# Patient Record
Sex: Female | Born: 1990 | Race: White | Hispanic: No | Marital: Single | Smoking: Never smoker
Health system: Southern US, Community
[De-identification: ages and names within clinical notes are randomized; demographics above are authoritative.]

## PROBLEM LIST (undated history)

## (undated) ENCOUNTER — Inpatient Hospital Stay (HOSPITAL_COMMUNITY): Payer: Self-pay

## (undated) DIAGNOSIS — K9429 Other complications of gastrostomy: Secondary | ICD-10-CM

## (undated) DIAGNOSIS — F410 Panic disorder [episodic paroxysmal anxiety] without agoraphobia: Secondary | ICD-10-CM

## (undated) DIAGNOSIS — R519 Headache, unspecified: Secondary | ICD-10-CM

## (undated) DIAGNOSIS — R51 Headache: Secondary | ICD-10-CM

## (undated) DIAGNOSIS — K219 Gastro-esophageal reflux disease without esophagitis: Secondary | ICD-10-CM

## (undated) HISTORY — DX: Gastro-esophageal reflux disease without esophagitis: K21.9

## (undated) HISTORY — PX: MYRINGOTOMY: SUR874

---
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
## 2012-01-31 ENCOUNTER — Inpatient Hospital Stay (HOSPITAL_COMMUNITY)
Admission: AD | Admit: 2012-01-31 | Discharge: 2012-02-02 | DRG: 775 | Disposition: A | Discharge status: Home / Self Care | Payer: Medicaid Other | Source: Ambulatory Visit | Attending: Obstetrics | Admitting: Obstetrics

## 2012-01-31 ENCOUNTER — Encounter (HOSPITAL_COMMUNITY): Payer: Self-pay | Admitting: *Deleted

## 2012-01-31 LAB — CBC
MCH: 27.5 pg (ref 26.0–34.0)
MCHC: 34.9 g/dL (ref 30.0–36.0)
Platelets: 221 10*3/uL (ref 150–400)
RBC: 4.14 MIL/uL (ref 3.87–5.11)

## 2012-01-31 LAB — RPR: RPR Ser Ql: NONREACTIVE

## 2012-01-31 MED ORDER — OXYTOCIN 40 UNITS IN LACTATED RINGERS INFUSION - SIMPLE MED
62.5000 mL/h | Freq: Once | INTRAVENOUS | Status: DC
  Filled 2012-01-31: qty 1000

## 2012-01-31 MED ORDER — LACTATED RINGERS IV SOLN
INTRAVENOUS | Status: DC
  Administered 2012-01-31 (×2): via INTRAVENOUS

## 2012-01-31 MED ORDER — LIDOCAINE HCL (PF) 1 % IJ SOLN
30.0000 mL | INTRAMUSCULAR | Status: DC | PRN
  Filled 2012-01-31: qty 30

## 2012-01-31 MED ORDER — DIPHENHYDRAMINE HCL 25 MG PO CAPS: 25.0000 mg | ORAL_CAPSULE | Freq: Four times a day (QID) | ORAL | Status: DC | PRN

## 2012-01-31 MED ORDER — OXYTOCIN BOLUS FROM INFUSION
500.0000 mL | Freq: Once | INTRAVENOUS | Status: DC
  Filled 2012-01-31: qty 500

## 2012-01-31 MED ORDER — EPHEDRINE 5 MG/ML INJ: 10.0000 mg | INTRAVENOUS | Status: DC | PRN

## 2012-01-31 MED ORDER — IBUPROFEN 600 MG PO TABS
600.0000 mg | ORAL_TABLET | Freq: Four times a day (QID) | ORAL | Status: DC
  Administered 2012-01-31 – 2012-02-02 (×6): 600 mg via ORAL
  Filled 2012-01-31 (×3): qty 1

## 2012-01-31 MED ORDER — LACTATED RINGERS IV SOLN: 500.0000 mL | Freq: Once | INTRAVENOUS | Status: DC

## 2012-01-31 MED ORDER — OXYCODONE-ACETAMINOPHEN 5-325 MG PO TABS
1.0000 | ORAL_TABLET | ORAL | Status: DC | PRN
  Administered 2012-02-01 – 2012-02-02 (×3): 1 via ORAL
  Filled 2012-01-31 (×3): qty 1

## 2012-01-31 MED ORDER — ACETAMINOPHEN 325 MG PO TABS: 650.0000 mg | ORAL_TABLET | ORAL | Status: DC | PRN

## 2012-01-31 MED ORDER — SIMETHICONE 80 MG PO CHEW
80.0000 mg | CHEWABLE_TABLET | ORAL | Status: DC | PRN
Start: 2012-01-31 — End: 2012-02-02

## 2012-01-31 MED ORDER — BENZOCAINE-MENTHOL 20-0.5 % EX AERO
1.0000 "application " | INHALATION_SPRAY | CUTANEOUS | Status: DC | PRN
  Administered 2012-01-31: 1 via TOPICAL
  Filled 2012-01-31: qty 56

## 2012-01-31 MED ORDER — LACTATED RINGERS IV SOLN: 500.0000 mL | INTRAVENOUS | Status: DC | PRN

## 2012-01-31 MED ORDER — FLEET ENEMA 7-19 GM/118ML RE ENEM: 1.0000 | ENEMA | RECTAL | Status: DC | PRN

## 2012-01-31 MED ORDER — FERROUS SULFATE 325 (65 FE) MG PO TABS
325.0000 mg | ORAL_TABLET | Freq: Two times a day (BID) | ORAL | Status: DC
  Administered 2012-01-31 – 2012-02-02 (×4): 325 mg via ORAL
  Filled 2012-01-31 (×4): qty 1

## 2012-01-31 MED ORDER — BUTORPHANOL TARTRATE 1 MG/ML IJ SOLN
1.0000 mg | INTRAMUSCULAR | Status: DC | PRN
  Administered 2012-01-31 (×3): 1 mg via INTRAVENOUS
  Filled 2012-01-31 (×3): qty 1

## 2012-01-31 MED ORDER — LANOLIN HYDROUS EX OINT: TOPICAL_OINTMENT | CUTANEOUS | Status: DC | PRN

## 2012-01-31 MED ORDER — ONDANSETRON HCL 4 MG/2ML IJ SOLN: 4.0000 mg | INTRAMUSCULAR | Status: DC | PRN

## 2012-01-31 MED ORDER — FENTANYL 2.5 MCG/ML BUPIVACAINE 1/10 % EPIDURAL INFUSION (WH - ANES): 14.0000 mL/h | INTRAMUSCULAR | Status: DC

## 2012-01-31 MED ORDER — PHENYLEPHRINE 40 MCG/ML (10ML) SYRINGE FOR IV PUSH (FOR BLOOD PRESSURE SUPPORT): 80.0000 ug | PREFILLED_SYRINGE | INTRAVENOUS | Status: DC | PRN

## 2012-01-31 MED ORDER — CITRIC ACID-SODIUM CITRATE 334-500 MG/5ML PO SOLN: 30.0000 mL | ORAL | Status: DC | PRN

## 2012-01-31 MED ORDER — PRENATAL MULTIVITAMIN CH
1.0000 | ORAL_TABLET | Freq: Every day | ORAL | Status: DC
  Administered 2012-01-31 – 2012-02-02 (×3): 1 via ORAL
  Filled 2012-01-31 (×3): qty 1

## 2012-01-31 MED ORDER — ONDANSETRON HCL 4 MG PO TABS: 4.0000 mg | ORAL_TABLET | ORAL | Status: DC | PRN

## 2012-01-31 MED ORDER — ZOLPIDEM TARTRATE 5 MG PO TABS: 5.0000 mg | ORAL_TABLET | Freq: Every evening | ORAL | Status: DC | PRN

## 2012-01-31 MED ORDER — TETANUS-DIPHTH-ACELL PERTUSSIS 5-2.5-18.5 LF-MCG/0.5 IM SUSP: 0.5000 mL | Freq: Once | INTRAMUSCULAR | Status: DC

## 2012-01-31 MED ORDER — IBUPROFEN 600 MG PO TABS
600.0000 mg | ORAL_TABLET | Freq: Four times a day (QID) | ORAL | Status: DC | PRN
  Administered 2012-02-01: 600 mg via ORAL
  Filled 2012-01-31 (×4): qty 1

## 2012-01-31 MED ORDER — DIPHENHYDRAMINE HCL 50 MG/ML IJ SOLN: 12.5000 mg | INTRAMUSCULAR | Status: DC | PRN

## 2012-01-31 MED ORDER — DIBUCAINE 1 % RE OINT: 1.0000 "application " | TOPICAL_OINTMENT | RECTAL | Status: DC | PRN

## 2012-01-31 MED ORDER — ONDANSETRON HCL 4 MG/2ML IJ SOLN
4.0000 mg | Freq: Four times a day (QID) | INTRAMUSCULAR | Status: DC | PRN
  Administered 2012-01-31: 4 mg via INTRAVENOUS
  Filled 2012-01-31: qty 2

## 2012-01-31 MED ORDER — SENNOSIDES-DOCUSATE SODIUM 8.6-50 MG PO TABS
2.0000 | ORAL_TABLET | Freq: Every day | ORAL | Status: DC
  Administered 2012-01-31 – 2012-02-01 (×2): 2 via ORAL

## 2012-01-31 MED ORDER — OXYCODONE-ACETAMINOPHEN 5-325 MG PO TABS: 1.0000 | ORAL_TABLET | ORAL | Status: DC | PRN

## 2012-01-31 MED ORDER — WITCH HAZEL-GLYCERIN EX PADS: 1.0000 "application " | MEDICATED_PAD | CUTANEOUS | Status: DC | PRN

## 2012-01-31 MED ORDER — RHO D IMMUNE GLOBULIN 1500 UNIT/2ML IJ SOLN
300.0000 ug | Freq: Once | INTRAMUSCULAR | Status: DC
  Filled 2012-01-31: qty 2

## 2012-01-31 NOTE — H&P (Signed)
This is Dr. Francoise Ceo dictating the history and physical on  Julie Hughes she's a 21 year old gravida 1 at 5 weeks and 6 days her EDC is 02/01/2012 negative GBS admitted in labor cervix is 5 cm she's now 9 cm 100% vertex -1 amniotomy was performed the fluids clear Past medical history negative Past surgical history negative Social history negative Family history negative System review noncontributory Physical exam revealed a well-developed female in labor HEENT negative Breasts negative Lungs clear to P&A Heart regular rhythm no murmurs no gallops Abdomen term Pelvic as described above Extremities negative
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
## 2012-02-01 LAB — CBC
HCT: 29.4 % — ABNORMAL LOW (ref 36.0–46.0)
Hemoglobin: 9.9 g/dL — ABNORMAL LOW (ref 12.0–15.0)
RBC: 3.61 MIL/uL — ABNORMAL LOW (ref 3.87–5.11)
WBC: 14 10*3/uL — ABNORMAL HIGH (ref 4.0–10.5)

## 2012-02-01 MED ORDER — RHO D IMMUNE GLOBULIN 1500 UNIT/2ML IJ SOLN
300.0000 ug | Freq: Once | INTRAMUSCULAR | Status: AC
  Administered 2012-02-01: 300 ug via INTRAMUSCULAR
  Filled 2012-02-01: qty 2

## 2012-02-01 MED ORDER — INFLUENZA VIRUS VACC SPLIT PF IM SUSP
0.5000 mL | INTRAMUSCULAR | Status: AC
  Administered 2012-02-01: 0.5 mL via INTRAMUSCULAR
  Filled 2012-02-01: qty 0.5

## 2012-02-01 NOTE — Progress Notes (Signed)
UR chart review completed.  

## 2012-02-01 NOTE — Progress Notes (Signed)
Patient ID: Julie Hughes, female   DOB: 1991-03-10, 21 y.o.   MRN: [id number] Postpartum day one Fundus firm Lochia moderate Legs negative Doing well

## 2012-02-02 LAB — RH IG WORKUP (INCLUDES ABO/RH)
ABO/RH(D): A NEG
Antibody Screen: POSITIVE
Fetal Screen: NEGATIVE
Gestational Age(Wks): 39.6

## 2012-02-02 MED ORDER — MEASLES, MUMPS & RUBELLA VAC ~~LOC~~ INJ
0.5000 mL | INJECTION | Freq: Once | SUBCUTANEOUS | Status: AC
  Administered 2012-02-02: 0.5 mL via SUBCUTANEOUS
  Filled 2012-02-02: qty 0.5

## 2012-02-02 NOTE — Discharge Summary (Signed)
Obstetric Discharge Summary Reason for Admission: onset of labor Prenatal Procedures: none Intrapartum Procedures: spontaneous vaginal delivery Postpartum Procedures: none Complications-Operative and Postpartum: none Hemoglobin  Date Value Range Status  02/01/2012 9.9* 12.0 - 15.0 g/dL Final     HCT  Date Value Range Status  02/01/2012 29.4* 36.0 - 46.0 % Final    Physical Exam:  General: alert Lochia: appropriate Uterine Fundus: firm Incision: healing well DVT Evaluation: No evidence of DVT seen on physical exam.  Discharge Diagnoses: Term Pregnancy-delivered  Discharge Information: Date: 02/02/2012 Activity: pelvic rest Diet: routine Medications: Percocet Condition: stable Instructions: refer to practice specific booklet Discharge to: home Follow-up Information    Call in 6 weeks to follow up.   Contact information:   [address]         Newborn Data: Live born female  Birth Weight: 5 lb 7.6 oz (2483 g) APGAR: 9, 9  Home with mother.  Julie Hughes A 02/02/2012, 7:21 AM

## 2013-11-03 ENCOUNTER — Emergency Department (HOSPITAL_COMMUNITY): Payer: Self-pay

## 2013-11-03 ENCOUNTER — Emergency Department (HOSPITAL_COMMUNITY)
Admission: EM | Admit: 2013-11-03 | Discharge: 2013-11-03 | Disposition: A | Discharge status: Home / Self Care | Payer: Self-pay | Attending: Emergency Medicine | Admitting: Emergency Medicine

## 2013-11-03 ENCOUNTER — Encounter (HOSPITAL_COMMUNITY): Payer: Self-pay | Admitting: Emergency Medicine

## 2013-11-03 ENCOUNTER — Emergency Department (HOSPITAL_COMMUNITY): Payer: Medicaid Other

## 2013-11-03 DIAGNOSIS — S0993XA Unspecified injury of face, initial encounter: Secondary | ICD-10-CM | POA: Insufficient documentation

## 2013-11-03 DIAGNOSIS — S40029A Contusion of unspecified upper arm, initial encounter: Secondary | ICD-10-CM | POA: Insufficient documentation

## 2013-11-03 DIAGNOSIS — S91009A Unspecified open wound, unspecified ankle, initial encounter: Secondary | ICD-10-CM

## 2013-11-03 DIAGNOSIS — S0083XA Contusion of other part of head, initial encounter: Secondary | ICD-10-CM | POA: Insufficient documentation

## 2013-11-03 DIAGNOSIS — S0003XA Contusion of scalp, initial encounter: Secondary | ICD-10-CM | POA: Insufficient documentation

## 2013-11-03 DIAGNOSIS — Y9389 Activity, other specified: Secondary | ICD-10-CM | POA: Insufficient documentation

## 2013-11-03 DIAGNOSIS — Z3202 Encounter for pregnancy test, result negative: Secondary | ICD-10-CM | POA: Insufficient documentation

## 2013-11-03 DIAGNOSIS — S81009A Unspecified open wound, unspecified knee, initial encounter: Secondary | ICD-10-CM | POA: Insufficient documentation

## 2013-11-03 DIAGNOSIS — S81011A Laceration without foreign body, right knee, initial encounter: Secondary | ICD-10-CM

## 2013-11-03 DIAGNOSIS — S298XXA Other specified injuries of thorax, initial encounter: Secondary | ICD-10-CM | POA: Insufficient documentation

## 2013-11-03 DIAGNOSIS — S81809A Unspecified open wound, unspecified lower leg, initial encounter: Secondary | ICD-10-CM

## 2013-11-03 DIAGNOSIS — S1093XA Contusion of unspecified part of neck, initial encounter: Principal | ICD-10-CM

## 2013-11-03 DIAGNOSIS — S8010XA Contusion of unspecified lower leg, initial encounter: Secondary | ICD-10-CM | POA: Insufficient documentation

## 2013-11-03 DIAGNOSIS — Y92410 Unspecified street and highway as the place of occurrence of the external cause: Secondary | ICD-10-CM | POA: Insufficient documentation

## 2013-11-03 DIAGNOSIS — S199XXA Unspecified injury of neck, initial encounter: Secondary | ICD-10-CM

## 2013-11-03 LAB — CBC
HCT: 36.1 % (ref 36.0–46.0)
Hemoglobin: 12.5 g/dL (ref 12.0–15.0)
MCH: 28.9 pg (ref 26.0–34.0)
MCHC: 34.6 g/dL (ref 30.0–36.0)
MCV: 83.6 fL (ref 78.0–100.0)
Platelets: 111 10*3/uL — ABNORMAL LOW (ref 150–400)
RBC: 4.32 MIL/uL (ref 3.87–5.11)
RDW: 13.1 % (ref 11.5–15.5)
WBC: 15.8 10*3/uL — AB (ref 4.0–10.5)

## 2013-11-03 LAB — COMPREHENSIVE METABOLIC PANEL
ALT: 15 U/L (ref 0–35)
ANION GAP: 23 — AB (ref 5–15)
AST: 27 U/L (ref 0–37)
Albumin: 4.1 g/dL (ref 3.5–5.2)
Alkaline Phosphatase: 48 U/L (ref 39–117)
BUN: 7 mg/dL (ref 6–23)
CO2: 14 meq/L — AB (ref 19–32)
CREATININE: 0.52 mg/dL (ref 0.50–1.10)
Calcium: 8.2 mg/dL — ABNORMAL LOW (ref 8.4–10.5)
Chloride: 104 mEq/L (ref 96–112)
Glucose, Bld: 99 mg/dL (ref 70–99)
Potassium: 3.3 mEq/L — ABNORMAL LOW (ref 3.7–5.3)
Sodium: 141 mEq/L (ref 137–147)
Total Bilirubin: 0.5 mg/dL (ref 0.3–1.2)
Total Protein: 7 g/dL (ref 6.0–8.3)

## 2013-11-03 LAB — SAMPLE TO BLOOD BANK

## 2013-11-03 LAB — PROTIME-INR
INR: 1.14 (ref 0.00–1.49)
PROTHROMBIN TIME: 14.6 s (ref 11.6–15.2)

## 2013-11-03 LAB — CDS SEROLOGY

## 2013-11-03 LAB — ETHANOL: ALCOHOL ETHYL (B): 177 mg/dL — AB (ref 0–11)

## 2013-11-03 LAB — POC URINE PREG, ED: Preg Test, Ur: NEGATIVE

## 2013-11-03 MED ORDER — ONDANSETRON 8 MG PO TBDP: 8.0000 mg | ORAL_TABLET | Freq: Three times a day (TID) | ORAL | Status: DC | PRN

## 2013-11-03 MED ORDER — TETANUS-DIPHTH-ACELL PERTUSSIS 5-2.5-18.5 LF-MCG/0.5 IM SUSP: 0.5000 mL | Freq: Once | INTRAMUSCULAR | Status: DC

## 2013-11-03 MED ORDER — IOHEXOL 300 MG/ML  SOLN
100.0000 mL | Freq: Once | INTRAMUSCULAR | Status: AC | PRN
  Administered 2013-11-03: 100 mL via INTRAVENOUS

## 2013-11-03 MED ORDER — HYDROCODONE-ACETAMINOPHEN 5-325 MG PO TABS: 1.0000 | ORAL_TABLET | Freq: Four times a day (QID) | ORAL | Status: DC | PRN

## 2013-11-03 MED ORDER — ONDANSETRON HCL 4 MG/2ML IJ SOLN
4.0000 mg | Freq: Once | INTRAMUSCULAR | Status: AC
  Administered 2013-11-03: 4 mg via INTRAVENOUS
  Filled 2013-11-03: qty 2

## 2013-11-03 MED ORDER — MORPHINE SULFATE 4 MG/ML IJ SOLN
4.0000 mg | Freq: Once | INTRAMUSCULAR | Status: AC
  Administered 2013-11-03: 4 mg via INTRAVENOUS
  Filled 2013-11-03: qty 1

## 2013-11-03 MED ORDER — SODIUM CHLORIDE 0.9 % IV BOLUS (SEPSIS)
1000.0000 mL | Freq: Once | INTRAVENOUS | Status: AC
  Administered 2013-11-03: 1000 mL via INTRAVENOUS

## 2013-11-03 MED ORDER — ONDANSETRON HCL 4 MG/2ML IJ SOLN
4.0000 mg | Freq: Once | INTRAMUSCULAR | Status: AC
  Administered 2013-11-03: 4 mg via INTRAVENOUS
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
## 2013-11-03 NOTE — ED Notes (Signed)
rn remains in ct with pt.
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
## 2013-11-03 NOTE — ED Notes (Signed)
Pt unable to give urine at this time.

## 2013-11-03 NOTE — ED Notes (Signed)
Pt vomiting, given zofran,
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
## 2013-11-03 NOTE — ED Notes (Signed)
Pt removed from spine board, per dr Loretha Staplerwofford and c collar switched to aspen collar

## 2013-11-03 NOTE — ED Notes (Signed)
RN, ED TECh and MD accompanied pt to  Ct.
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
## 2013-11-03 NOTE — ED Notes (Signed)
SHP at bedside.  

## 2013-11-03 NOTE — ED Notes (Signed)
Pt nauseous & vomiting when sitting up for discharge, [place], MD notified, plan to medicate for nausea & reassess pt
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
## 2013-11-03 NOTE — ED Notes (Signed)
Lequita HaltMorgan called to request emergent CT head on patient - CT holding CT #1 @ 0510

## 2013-11-03 NOTE — ED Notes (Signed)
MD at bedside. 

## 2013-12-10 ENCOUNTER — Encounter (HOSPITAL_COMMUNITY): Payer: Self-pay | Admitting: *Deleted

## 2013-12-19 ENCOUNTER — Encounter (HOSPITAL_COMMUNITY): Payer: Self-pay | Admitting: Emergency Medicine

## 2013-12-19 ENCOUNTER — Emergency Department (HOSPITAL_COMMUNITY)
Admission: EM | Admit: 2013-12-19 | Discharge: 2013-12-19 | Disposition: A | Discharge status: Home / Self Care | Payer: Medicaid Other | Attending: Emergency Medicine | Admitting: Emergency Medicine

## 2013-12-19 ENCOUNTER — Emergency Department (HOSPITAL_COMMUNITY): Payer: Medicaid Other

## 2013-12-19 DIAGNOSIS — R1031 Right lower quadrant pain: Secondary | ICD-10-CM | POA: Insufficient documentation

## 2013-12-19 DIAGNOSIS — Z880 Allergy status to penicillin: Secondary | ICD-10-CM | POA: Insufficient documentation

## 2013-12-19 DIAGNOSIS — R112 Nausea with vomiting, unspecified: Secondary | ICD-10-CM | POA: Insufficient documentation

## 2013-12-19 DIAGNOSIS — Z3202 Encounter for pregnancy test, result negative: Secondary | ICD-10-CM | POA: Insufficient documentation

## 2013-12-19 DIAGNOSIS — R1030 Lower abdominal pain, unspecified: Secondary | ICD-10-CM

## 2013-12-19 DIAGNOSIS — N898 Other specified noninflammatory disorders of vagina: Secondary | ICD-10-CM | POA: Insufficient documentation

## 2013-12-19 LAB — COMPREHENSIVE METABOLIC PANEL
ALBUMIN: 4.4 g/dL (ref 3.5–5.2)
ALK PHOS: 56 U/L (ref 39–117)
ALT: 10 U/L (ref 0–35)
ANION GAP: 13 (ref 5–15)
AST: 12 U/L (ref 0–37)
BUN: 7 mg/dL (ref 6–23)
CALCIUM: 10 mg/dL (ref 8.4–10.5)
CO2: 25 mEq/L (ref 19–32)
Chloride: 98 mEq/L (ref 96–112)
Creatinine, Ser: 0.59 mg/dL (ref 0.50–1.10)
GFR calc non Af Amer: >90 mL/min (ref >90)
Glucose, Bld: 97 mg/dL (ref 70–99)
POTASSIUM: 3.9 meq/L (ref 3.7–5.3)
SODIUM: 136 meq/L — AB (ref 137–147)
TOTAL PROTEIN: 8.2 g/dL (ref 6.0–8.3)
Total Bilirubin: 0.9 mg/dL (ref 0.3–1.2)

## 2013-12-19 LAB — CBC WITH DIFFERENTIAL/PLATELET
BASOS PCT: 1 % (ref 0–1)
Basophils Absolute: 0.1 10*3/uL (ref 0.0–0.1)
EOS ABS: 0 10*3/uL (ref 0.0–0.7)
EOS PCT: 0 % (ref 0–5)
HCT: 41.6 % (ref 36.0–46.0)
Hemoglobin: 14.1 g/dL (ref 12.0–15.0)
Lymphocytes Relative: 13 % (ref 12–46)
Lymphs Abs: 1.3 10*3/uL (ref 0.7–4.0)
MCH: 28.7 pg (ref 26.0–34.0)
MCHC: 33.9 g/dL (ref 30.0–36.0)
MCV: 84.6 fL (ref 78.0–100.0)
MONOS PCT: 11 % (ref 3–12)
Monocytes Absolute: 1.1 10*3/uL — ABNORMAL HIGH (ref 0.1–1.0)
NEUTROS PCT: 75 % (ref 43–77)
Neutro Abs: 7.5 10*3/uL (ref 1.7–7.7)
PLATELETS: 182 10*3/uL (ref 150–400)
RBC: 4.92 MIL/uL (ref 3.87–5.11)
RDW: 13.6 % (ref 11.5–15.5)
WBC: 10.1 10*3/uL (ref 4.0–10.5)

## 2013-12-19 LAB — URINALYSIS, ROUTINE W REFLEX MICROSCOPIC
Bilirubin Urine: NEGATIVE
GLUCOSE, UA: NEGATIVE mg/dL
Hgb urine dipstick: NEGATIVE
KETONES UR: NEGATIVE mg/dL
NITRITE: NEGATIVE
PROTEIN: NEGATIVE mg/dL
Specific Gravity, Urine: 1.013 (ref 1.005–1.030)
UROBILINOGEN UA: 1 mg/dL (ref 0.0–1.0)
pH: 7.5 (ref 5.0–8.0)

## 2013-12-19 LAB — URINE MICROSCOPIC-ADD ON

## 2013-12-19 LAB — POC URINE PREG, ED: PREG TEST UR: NEGATIVE

## 2013-12-19 LAB — HIV ANTIBODY (ROUTINE TESTING W REFLEX): HIV: NONREACTIVE

## 2013-12-19 LAB — WET PREP, GENITAL
CLUE CELLS WET PREP: NONE SEEN
TRICH WET PREP: NONE SEEN
Yeast Wet Prep HPF POC: NONE SEEN

## 2013-12-19 LAB — LIPASE, BLOOD: Lipase: 19 U/L (ref 11–59)

## 2013-12-19 MED ORDER — MORPHINE SULFATE 4 MG/ML IJ SOLN
4.0000 mg | Freq: Once | INTRAMUSCULAR | Status: AC
  Administered 2013-12-19: 4 mg via INTRAVENOUS
  Filled 2013-12-19: qty 1

## 2013-12-19 MED ORDER — OXYCODONE-ACETAMINOPHEN 5-325 MG PO TABS: 1.0000 | ORAL_TABLET | ORAL | Status: DC | PRN

## 2013-12-19 MED ORDER — ONDANSETRON HCL 4 MG/2ML IJ SOLN
4.0000 mg | Freq: Once | INTRAMUSCULAR | Status: AC
  Administered 2013-12-19: 4 mg via INTRAVENOUS
  Filled 2013-12-19: qty 2

## 2013-12-19 MED ORDER — IOHEXOL 300 MG/ML  SOLN
100.0000 mL | Freq: Once | INTRAMUSCULAR | Status: AC | PRN
  Administered 2013-12-19: 100 mL via INTRAVENOUS

## 2013-12-19 NOTE — ED Provider Notes (Signed)
CSN: [id number]     Arrival date & time 12/19/13  1333 History   First MD Initiated Contact with Patient 12/19/13 1501     Chief Complaint  Patient presents with  . Abdominal Pain     (Consider location/radiation/quality/duration/timing/severity/associated sxs/prior Treatment) Patient is a 23 y.o. female presenting with abdominal pain. The history is provided by the patient.  Abdominal Pain Pain location:  RLQ Pain quality: sharp   Pain radiates to:  Does not radiate Pain severity:  Moderate Onset quality:  Sudden Duration:  4 days Timing:  Constant Progression:  Worsening Chronicity:  New Context: not recent sexual activity   Relieved by:  Nothing Worsened by:  Movement Ineffective treatments:  None tried Associated symptoms: nausea, vaginal discharge and vomiting   Associated symptoms: no chills, no fever, no hematuria and no vaginal bleeding     History reviewed. No pertinent past medical history. History reviewed. No pertinent past surgical history. History reviewed. No pertinent family history. History  Substance Use Topics  . Smoking status: Never Smoker   . Smokeless tobacco: Not on file  . Alcohol Use: Yes     Comment: weekly   OB History   Grav Para Term Preterm Abortions TAB SAB Ect Mult Living   Review of Systems  Constitutional: Negative for fever and chills.  Gastrointestinal: Positive for nausea, vomiting and abdominal pain.  Genitourinary: Positive for vaginal discharge. Negative for hematuria and vaginal bleeding.  All other systems reviewed and are negative.     Allergies  Penicillins  Home Medications   Prior to Admission medications   Medication Sig Start Date End Date Taking? Authorizing Provider  HYDROcodone-acetaminophen (NORCO/VICODIN) 5-325 MG per tablet Take 1-2 tablets by mouth every 6 (six) hours as needed. 11/03/13   Candyce Churn III, MD  ondansetron (ZOFRAN-ODT) 8 MG disintegrating tablet Take 1 tablet (8  mg total) by mouth every 8 (eight) hours as needed for nausea or vomiting. 11/03/13   Juliet Rude. Pickering, MD   BP 126/81  Pulse 102  Temp(Src) 97.8 F (36.6 C) (Oral)  Resp 16  SpO2 99%  LMP 12/12/2013  Breastfeeding? No Physical Exam  Nursing note and vitals reviewed. Constitutional: She is oriented to person, place, and time. She appears well-developed and well-nourished.  Non-toxic appearance. No distress.  HENT:  Head: Normocephalic and atraumatic.  Eyes: Conjunctivae, EOM and lids are normal. Pupils are equal, round, and reactive to light.  Neck: Normal range of motion. Neck supple. No tracheal deviation present. No mass present.  Cardiovascular: Normal rate, regular rhythm and normal heart sounds.  Exam reveals no gallop.   No murmur heard. Pulmonary/Chest: Effort normal and breath sounds normal. No stridor. No respiratory distress. She has no decreased breath sounds. She has no wheezes. She has no rhonchi. She has no rales.  Abdominal: Soft. Normal appearance and bowel sounds are normal. She exhibits no distension. There is tenderness in the right lower quadrant. There is no rigidity, no rebound, no guarding and no CVA tenderness.    Genitourinary: Vaginal discharge found.  Musculoskeletal: Normal range of motion. She exhibits no edema and no tenderness.  Neurological: She is alert and oriented to person, place, and time. She has normal strength. No cranial nerve deficit or sensory deficit. GCS eye subscore is 4. GCS verbal subscore is 5. GCS motor subscore is 6.  Skin: Skin is warm and dry. No abrasion and no rash  noted.  Psychiatric: She has a normal mood and affect. Her speech is normal and behavior is normal.    ED Course  Procedures (including critical care time) Labs Review Labs Reviewed  WET PREP, GENITAL - Abnormal; Notable for the following:    WBC, Wet Prep HPF POC MANY (*)    All other components within normal limits  CBC WITH DIFFERENTIAL - Abnormal; Notable  for the following:    Monocytes Absolute 1.1 (*)    All other components within normal limits  COMPREHENSIVE METABOLIC PANEL - Abnormal; Notable for the following:    Sodium 136 (*)    All other components within normal limits  URINALYSIS, ROUTINE W REFLEX MICROSCOPIC - Abnormal; Notable for the following:    APPearance CLOUDY (*)    Leukocytes, UA SMALL (*)    All other components within normal limits  URINE MICROSCOPIC-ADD ON - Abnormal; Notable for the following:    Squamous Epithelial / LPF FEW (*)    All other components within normal limits  GC/CHLAMYDIA PROBE AMP  LIPASE, BLOOD  HIV ANTIBODY (ROUTINE TESTING)  POC URINE PREG, ED    Imaging Review Ct Abdomen Pelvis W Contrast  12/19/2013   CLINICAL DATA:  Right lower quadrant pain for 4 days.  EXAM: CT ABDOMEN AND PELVIS WITH CONTRAST  TECHNIQUE: Multidetector CT imaging of the abdomen and pelvis was performed using the standard protocol following bolus administration of intravenous contrast.  CONTRAST:  OMNIPAQUE IOHEXOL 300 MG/ML  SOLN  COMPARISON:  None.  FINDINGS: LOWER CHEST: Unremarkable.  ABDOMEN/PELVIS:  Liver: No focal abnormality.  Biliary: No evidence of biliary obstruction or stone.  Pancreas: Unremarkable.  Spleen: Unremarkable.  Adrenals: Unremarkable.  Kidneys and ureters: 13 mm area of nonenhancement in the upper pole right kidney with ill-defined margins and irregular shape. There is subtle surrounding fat infiltration, best appreciated on reformatted images. No striated nephrogram, urinary obstruction, or matrure abscess. No nephrolithiasis. Noted leukocytosis 1 month prior in the EMR.  Bladder: Unremarkable.  Reproductive: Bilateral ovarian follicles are prominent in number, but diffuse and not peripheral. No chart indication of clinical features suggestive of polycystic ovarian syndrome. Dilated left gonadal vein with multiple channels inferiorly, measuring up to 6 mm.  Bowel: No obstruction. Normal appendix.   Retroperitoneum: No mass or adenopathy.  Peritoneum: Small volume free pelvic fluid, often physiologic.  Vascular: No acute abnormality.  OSSEOUS: No acute abnormalities.  IMPRESSION: 1. 13 mm lesion in the upper pole right kidney. This could represent complicated cyst or lobar nephronia (sequela of recent pyelonephritis). Correlate with urinalysis and recent medical history. Suggest ultrasound follow-up after any therapy. 2. Normal appendix. 3. Left gonadal vein reflux.   Electronically Signed   By: Tiburcio Pea M.D.   On: 12/19/2013 16:19     EKG Interpretation None      MDM   Final diagnoses:  None    Patient given pain meds and feels better. Informed of her CT results. No surgical process identified at this time.    Toy Baker, MD 12/19/13 1739

## 2013-12-19 NOTE — Discharge Instructions (Signed)
Abdominal Pain, Women °Abdominal (stomach, pelvic, or belly) pain can be caused by many things. It is important to tell your doctor: °· The location of the pain. °· Does it come and go or is it present all the time? °· Are there things that start the pain (eating certain foods, exercise)? °· Are there other symptoms associated with the pain (fever, nausea, vomiting, diarrhea)? °All of this is helpful to know when trying to find the cause of the pain. °CAUSES  °· Stomach: virus or bacteria infection, or ulcer. °· Intestine: appendicitis (inflamed appendix), regional ileitis (Crohn's disease), ulcerative colitis (inflamed colon), irritable bowel syndrome, diverticulitis (inflamed diverticulum of the colon), or cancer of the stomach or intestine. °· Gallbladder disease or stones in the gallbladder. °· Kidney disease, kidney stones, or infection. °· Pancreas infection or cancer. °· Fibromyalgia (pain disorder). °· Diseases of the female organs: °¨ Uterus: fibroid (non-cancerous) tumors or infection. °¨ Fallopian tubes: infection or tubal pregnancy. °¨ Ovary: cysts or tumors. °¨ Pelvic adhesions (scar tissue). °¨ Endometriosis (uterus lining tissue growing in the pelvis and on the pelvic organs). °¨ Pelvic congestion syndrome (female organs filling up with blood just before the menstrual period). °¨ Pain with the menstrual period. °¨ Pain with ovulation (producing an egg). °¨ Pain with an IUD (intrauterine device, birth control) in the uterus. °¨ Cancer of the female organs. °· Functional pain (pain not caused by a disease, may improve without treatment). °· Psychological pain. °· Depression. °DIAGNOSIS  °Your doctor will decide the seriousness of your pain by doing an examination. °· Blood tests. °· X-rays. °· Ultrasound. °· CT scan (computed tomography, special type of X-ray). °· MRI (magnetic resonance imaging). °· Cultures, for infection. °· Barium enema (dye inserted in the large intestine, to better view it with  X-rays). °· Colonoscopy (looking in intestine with a lighted tube). °· Laparoscopy (minor surgery, looking in abdomen with a lighted tube). °· Major abdominal exploratory surgery (looking in abdomen with a large incision). °TREATMENT  °The treatment will depend on the cause of the pain.  °· Many cases can be observed and treated at home. °· Over-the-counter medicines recommended by your caregiver. °· Prescription medicine. °· Antibiotics, for infection. °· Birth control pills, for painful periods or for ovulation pain. °· Hormone treatment, for endometriosis. °· Nerve blocking injections. °· Physical therapy. °· Antidepressants. °· Counseling with a psychologist or psychiatrist. °· Minor or major surgery. °HOME CARE INSTRUCTIONS  °· Do not take laxatives, unless directed by your caregiver. °· Take over-the-counter pain medicine only if ordered by your caregiver. Do not take aspirin because it can cause an upset stomach or bleeding. °· Try a clear liquid diet (broth or water) as ordered by your caregiver. Slowly move to a bland diet, as tolerated, if the pain is related to the stomach or intestine. °· Have a thermometer and take your temperature several times a day, and record it. °· Bed rest and sleep, if it helps the pain. °· Avoid sexual intercourse, if it causes pain. °· Avoid stressful situations. °· Keep your follow-up appointments and tests, as your caregiver orders. °· If the pain does not go away with medicine or surgery, you may try: °¨ Acupuncture. °¨ Relaxation exercises (yoga, meditation). °¨ Group therapy. °¨ Counseling. °SEEK MEDICAL CARE IF:  °· You notice certain foods cause stomach pain. °· Your home care treatment is not helping your pain. °· You need stronger pain medicine. °· You want your IUD removed. °· You feel faint or   lightheaded. °· You develop nausea and vomiting. °· You develop a rash. °· You are having side effects or an allergy to your medicine. °SEEK IMMEDIATE MEDICAL CARE IF:  °· Your  pain does not go away or gets worse. °· You have a fever. °· Your pain is felt only in portions of the abdomen. The right side could possibly be appendicitis. The left lower portion of the abdomen could be colitis or diverticulitis. °· You are passing blood in your stools (bright red or black tarry stools, with or without vomiting). °· You have blood in your urine. °· You develop chills, with or without a fever. °· You pass out. °MAKE SURE YOU:  °· Understand these instructions. °· Will watch your condition. °· Will get help right away if you are not doing well or get worse. °Document Released: 01/30/2007 Document Revised: 08/19/2013 Document Reviewed: 02/19/2009 °ExitCare® Patient Information ©2015 [employer]. This information is not intended to replace advice given to you by your health care provider. Make sure you discuss any questions you have with your health care provider. ° °

## 2013-12-19 NOTE — ED Notes (Addendum)
Pt reports abdominal pain since Sunday, pain 8/10. Vomited on Sunday, not since then. Denies dysuria. Denies trauma or injury.

## 2013-12-20 LAB — GC/CHLAMYDIA PROBE AMP
CT PROBE, AMP APTIMA: NEGATIVE
GC Probe RNA: NEGATIVE

## 2014-02-17 ENCOUNTER — Encounter (HOSPITAL_COMMUNITY): Payer: Self-pay | Admitting: Emergency Medicine

## 2014-08-11 ENCOUNTER — Emergency Department
Admit: 2014-08-11 | Disposition: A | Discharge status: Home / Self Care | Payer: Self-pay | Admitting: Emergency Medicine

## 2014-08-11 LAB — COMPREHENSIVE METABOLIC PANEL
ALBUMIN: 4.9 g/dL
ALK PHOS: 55 U/L
ALT: 16 U/L
Anion Gap: 13 (ref 7–16)
BUN: 13 mg/dL
Bilirubin,Total: 0.9 mg/dL
CHLORIDE: 101 mmol/L
CREATININE: 0.61 mg/dL
Calcium, Total: 9.9 mg/dL
Co2: 21 mmol/L — ABNORMAL LOW
EGFR (African American): >60
EGFR (Non-African Amer.): >60
Glucose: 120 mg/dL — ABNORMAL HIGH
Potassium: 3.2 mmol/L — ABNORMAL LOW
SGOT(AST): 27 U/L
SODIUM: 135 mmol/L
TOTAL PROTEIN: 8.5 g/dL — AB

## 2014-08-11 LAB — CBC WITH DIFFERENTIAL/PLATELET
BASOS ABS: 0 10*3/uL (ref 0.0–0.1)
Basophil %: 0 %
EOS ABS: 0 10*3/uL (ref 0.0–0.7)
Eosinophil %: 0 %
HCT: 41 % (ref 35.0–47.0)
HGB: 14.1 g/dL (ref 12.0–16.0)
LYMPHS ABS: 1.1 10*3/uL (ref 1.0–3.6)
Lymphocyte %: 7.6 %
MCH: 30.2 pg (ref 26.0–34.0)
MCHC: 34.5 g/dL (ref 32.0–36.0)
MCV: 88 fL (ref 80–100)
MONO ABS: 0.6 x10 3/mm (ref 0.2–0.9)
Monocyte %: 4.7 %
NEUTROS PCT: 87.7 %
Neutrophil #: 12 10*3/uL — ABNORMAL HIGH (ref 1.4–6.5)
Platelet: 189 10*3/uL (ref 150–440)
RBC: 4.68 10*6/uL (ref 3.80–5.20)
RDW: 12.8 % (ref 11.5–14.5)
WBC: 13.7 10*3/uL — ABNORMAL HIGH (ref 3.6–11.0)

## 2014-08-11 LAB — LIPASE, BLOOD: Lipase: 24 U/L

## 2015-01-31 ENCOUNTER — Encounter: Payer: Self-pay | Admitting: Emergency Medicine

## 2015-01-31 ENCOUNTER — Emergency Department
Admission: EM | Admit: 2015-01-31 | Discharge: 2015-01-31 | Disposition: A | Discharge status: Home / Self Care | Payer: Medicaid Other | Attending: Emergency Medicine | Admitting: Emergency Medicine

## 2015-01-31 DIAGNOSIS — Z880 Allergy status to penicillin: Secondary | ICD-10-CM | POA: Insufficient documentation

## 2015-01-31 DIAGNOSIS — O210 Mild hyperemesis gravidarum: Secondary | ICD-10-CM | POA: Diagnosis not present

## 2015-01-31 DIAGNOSIS — R1011 Right upper quadrant pain: Secondary | ICD-10-CM | POA: Insufficient documentation

## 2015-01-31 DIAGNOSIS — O9989 Other specified diseases and conditions complicating pregnancy, childbirth and the puerperium: Secondary | ICD-10-CM | POA: Diagnosis not present

## 2015-01-31 DIAGNOSIS — Z3A10 10 weeks gestation of pregnancy: Secondary | ICD-10-CM | POA: Insufficient documentation

## 2015-01-31 DIAGNOSIS — R1012 Left upper quadrant pain: Secondary | ICD-10-CM | POA: Insufficient documentation

## 2015-01-31 LAB — BASIC METABOLIC PANEL
Anion gap: 11 (ref 5–15)
BUN: 12 mg/dL (ref 6–20)
CO2: 21 mmol/L — ABNORMAL LOW (ref 22–32)
Calcium: 10.3 mg/dL (ref 8.9–10.3)
Chloride: 101 mmol/L (ref 101–111)
Creatinine, Ser: 0.57 mg/dL (ref 0.44–1.00)
GFR calc Af Amer: >60 mL/min (ref >60)
GFR calc non Af Amer: >60 mL/min (ref >60)
GLUCOSE: 112 mg/dL — AB (ref 65–99)
POTASSIUM: 3.4 mmol/L — AB (ref 3.5–5.1)
Sodium: 133 mmol/L — ABNORMAL LOW (ref 135–145)

## 2015-01-31 LAB — CBC WITH DIFFERENTIAL/PLATELET
BASOS PCT: 0 %
Basophils Absolute: 0 10*3/uL (ref 0–0.1)
Eosinophils Absolute: 0 10*3/uL (ref 0–0.7)
Eosinophils Relative: 0 %
HCT: 38.4 % (ref 35.0–47.0)
Hemoglobin: 13.5 g/dL (ref 12.0–16.0)
LYMPHS ABS: 1.1 10*3/uL (ref 1.0–3.6)
Lymphocytes Relative: 7 %
MCH: 30.3 pg (ref 26.0–34.0)
MCHC: 35.3 g/dL (ref 32.0–36.0)
MCV: 85.9 fL (ref 80.0–100.0)
Monocytes Absolute: 0.9 10*3/uL (ref 0.2–0.9)
Monocytes Relative: 6 %
NEUTROS ABS: 13 10*3/uL — AB (ref 1.4–6.5)
NEUTROS PCT: 87 %
Platelets: 201 10*3/uL (ref 150–440)
RBC: 4.47 MIL/uL (ref 3.80–5.20)
RDW: 12.1 % (ref 11.5–14.5)
WBC: 15.1 10*3/uL — ABNORMAL HIGH (ref 3.6–11.0)

## 2015-01-31 LAB — URINALYSIS COMPLETE WITH MICROSCOPIC (ARMC ONLY)
Bilirubin Urine: NEGATIVE
Glucose, UA: 50 mg/dL — AB
Hgb urine dipstick: NEGATIVE
Leukocytes, UA: NEGATIVE
Nitrite: NEGATIVE
PROTEIN: 100 mg/dL — AB
SPECIFIC GRAVITY, URINE: 1.032 — AB (ref 1.005–1.030)
pH: 6 (ref 5.0–8.0)

## 2015-01-31 LAB — HCG, QUANTITATIVE, PREGNANCY: hCG, Beta Chain, Quant, S: 186683 m[IU]/mL — ABNORMAL HIGH (ref <5)

## 2015-01-31 MED ORDER — DEXTROSE-NACL 5-0.9 % IV SOLN
INTRAVENOUS | Status: AC
  Administered 2015-01-31: 500 mL via INTRAVENOUS

## 2015-01-31 MED ORDER — METOCLOPRAMIDE HCL 5 MG/ML IJ SOLN
5.0000 mg | Freq: Once | INTRAMUSCULAR | Status: AC
  Administered 2015-01-31: 5 mg via INTRAVENOUS
  Filled 2015-01-31: qty 2

## 2015-01-31 MED ORDER — ONDANSETRON HCL 4 MG/2ML IJ SOLN
4.0000 mg | Freq: Once | INTRAMUSCULAR | Status: AC
  Administered 2015-01-31: 4 mg via INTRAVENOUS
  Filled 2015-01-31: qty 2

## 2015-01-31 MED ORDER — METOCLOPRAMIDE HCL 5 MG PO TABS: 5.0000 mg | ORAL_TABLET | Freq: Three times a day (TID) | ORAL | Status: DC | PRN

## 2015-01-31 NOTE — Discharge Instructions (Signed)
Take Reglan, 5 mg, as needed for nausea. You may repeat this dose after one hour if needed. Take up to 5 tablets in one day if needed. Follow-up with Dr. Gaynell FaceMarshall as planned or with [facility]. Return to the emergency department if you have uncontrolled nausea, if you're unable to drink fluids, if you have any pain or other urgent concerns.  Morning Sickness Morning sickness is when you feel sick to your stomach (nauseous) during pregnancy. You may feel sick to your stomach and throw up (vomit). You may feel sick in the morning, but you can feel this way any time of day. Some women feel very sick to their stomach and cannot stop throwing up (hyperemesis gravidarum). HOME CARE  Only take medicines as told by your doctor.  Take multivitamins as told by your doctor. Taking multivitamins before getting pregnant can stop or lessen the harshness of morning sickness.  Eat dry toast or unsalted crackers before getting out of bed.  Eat 5 to 6 small meals a day.  Eat dry and bland foods like rice and baked potatoes.  Do not drink liquids with meals. Drink between meals.  Do not eat greasy, fatty, or spicy foods.  Have someone cook for you if the smell of food causes you to feel sick or throw up.  If you feel sick to your stomach after taking prenatal vitamins, take them at night or with a snack.  Eat protein when you need a snack (nuts, yogurt, cheese).  Eat unsweetened gelatins for dessert.  Wear a bracelet used for sea sickness (acupressure wristband).  Go to a doctor that puts thin needles into certain body points (acupuncture) to improve how you feel.  Do not smoke.  Use a humidifier to keep the air in your house free of odors.  Get lots of fresh air. GET HELP IF:  You need medicine to feel better.  You feel dizzy or lightheaded.  You are losing weight. GET HELP RIGHT AWAY IF:   You feel very sick to your stomach and cannot stop throwing up.  You pass out  (faint). MAKE SURE YOU:  Understand these instructions.  Will watch your condition.  Will get help right away if you are not doing well or get worse.   This information is not intended to replace advice given to you by your health care provider. Make sure you discuss any questions you have with your health care provider.   Document Released: 05/12/2004 Document Revised: 04/25/2014 Document Reviewed: 09/19/2012 Elsevier Interactive Patient Education [employer]2016 [employer]

## 2015-01-31 NOTE — ED Notes (Signed)
Reviewed d/c instructions, follow-up care, and prescriptions with patient. Pt verbalized understanding.  

## 2015-01-31 NOTE — ED Notes (Signed)
Pt reports being [redacted] weeks pregnant and has vomited 20-30 times per pt over the past 24 hours. Denies diarrhea. alos report lower abd pain 5/10. Denies vaginal discharge or bleeding

## 2015-01-31 NOTE — ED Provider Notes (Signed)
[facility] Emergency Department Provider Note  ____________________________________________  Time seen: 1745  I have reviewed the triage vital signs and the nursing notes.   HISTORY  Chief Complaint Emesis During Pregnancy     HPI Julie Hughes is a 24 y.o. female who reports she is approximately [redacted] weeks pregnant. She has not had any prenatal care. She reports she has been having problems with nausea over the past 2-3 weeks, but his getting worse in the past 2 days. She reports multiple episodes of emesis in the past 24 hours. She denies seeing any abdominal pain with me, although apparently she mentioned it to the triage nurse who reported a pain level of 5/10. She denies any fever, diarrhea, or flank pain.    No past medical history on file.  There are no active problems to display for this patient.   No past surgical history on file.  Current Outpatient Rx  Name  Route  Sig  Dispense  Refill  . metoCLOPramide (REGLAN) 5 MG tablet   Oral   Take 1 tablet (5 mg total) by mouth every 8 (eight) hours as needed for nausea.   15 tablet   0   . oxyCODONE-acetaminophen (PERCOCET/ROXICET) 5-325 MG per tablet   Oral   Take 1-2 tablets by mouth every 4 (four) hours as needed for severe pain.   15 tablet   0     Allergies Penicillins  No family history on file.  Social History Social History  Substance Use Topics  . Smoking status: Never Smoker   . Smokeless tobacco: Not on file  . Alcohol Use: Yes     Comment: weekly    Review of Systems  Constitutional: Negative for fever. ENT: Negative for sore throat. Cardiovascular: Negative for chest pain. Respiratory: Negative for cough. Gastrointestinal: Positive for nausea vomiting. No diarrhea. Genitourinary: Gravid, 10 weeks. Musculoskeletal: No myalgias or injuries. Skin: Negative for rash. Neurological: Negative for paresthesia or weakness   10-point ROS otherwise  negative.  ____________________________________________   PHYSICAL EXAM:  VITAL SIGNS: ED Triage Vitals  Enc Vitals Group     BP 01/31/15 1701 121/70 mmHg     Pulse Rate 01/31/15 1701 97     Resp 01/31/15 1701 18     Temp 01/31/15 1701 99.4 F (37.4 C)     Temp Source 01/31/15 1701 Oral     SpO2 01/31/15 1701 100 %     Weight 01/31/15 1701 140 lb (63.504 kg)     Height 01/31/15 1701  (1.6 m)     Head Cir --      Peak Flow --      Pain Score 01/31/15 1704 5     Pain Loc --      Pain Edu? --      Excl. in GC? --     Constitutional:  Alert and oriented. Appears a little ill, consistent with nausea and vomiting, but no acute distress. ENT   Head: Normocephalic and atraumatic.   Nose: No congestion/rhinnorhea.    Cardiovascular: Normal rate, regular rhythm, no murmur noted Respiratory:  Normal respiratory effort, no tachypnea.    Breath sounds are clear and equal bilaterally.  Gastrointestinal: Soft, minimal tenderness in the epigastric area consistent with recent emesis. No pain in the right or left upper quadrants. Lower abdomen nontender. No distention.  Back: No muscle spasm, no tenderness, no CVA tenderness. Musculoskeletal: No deformity noted. Nontender with normal range of motion in all extremities.  No  noted edema. Neurologic:  Normal speech and language. No gross focal neurologic deficits are appreciated.  Skin:  Skin is warm, dry. No rash noted. Psychiatric: Mood and affect are normal. Speech and behavior are normal.  ____________________________________________    LABS (pertinent positives/negatives)  Labs Reviewed  BASIC METABOLIC PANEL - Abnormal; Notable for the following:    Sodium 133 (*)    Potassium 3.4 (*)    CO2 21 (*)    Glucose, Bld 112 (*)    All other components within normal limits  CBC WITH DIFFERENTIAL/PLATELET - Abnormal; Notable for the following:    WBC 15.1 (*)    Neutro Abs 13.0 (*)    All other components within normal  limits  HCG, QUANTITATIVE, PREGNANCY - Abnormal; Notable for the following:    hCG, Beta [name], Quant, [name] (*)    All other components within normal limits  URINALYSIS COMPLETEWITH MICROSCOPIC ([facility] ONLY) - Abnormal; Notable for the following:    Color, Urine YELLOW (*)    APPearance CLEAR (*)    Glucose, UA 50 (*)    Ketones, ur 2+ (*)    Specific Gravity, Urine 1.032 (*)    Protein, ur 100 (*)    Bacteria, UA RARE (*)    Squamous Epithelial / LPF 6-30 (*)    All other components within normal limits     ____________________________________________  ____________________________________________   PROCEDURES    ____________________________________________   INITIAL IMPRESSION / ASSESSMENT AND PLAN / ED COURSE  Pertinent labs & imaging results that were available during my care of the patient were reviewed by me and considered in my medical decision making (see chart for details).  Well-nourished well-developed 24 year old female who is pregnant and now with nausea and vomiting. High suspicion for hyperemesis gravidarum. No focal abdominal pain. We will treat the patient with IV fluids, D5 normal saline, Zofran, and check urine for ketones, labs for hCG, CBC, and metabolic panel.   ----------------------------------------- 7:08 PM on 01/31/2015 -----------------------------------------  Patient reports her nausea had improved, but has now returned. We will treat with Reglan, 5 mg IV.  ----------------------------------------- 8:00 PM on 01/31/2015 -----------------------------------------  Urine shows no infection but it does show 2+ ketones Reassessment at this time finds the patient feeling better. She has received half the liter of fluid that I initially ordered. We will continue IV fluids and begin a by mouth challenge and a little bit. She continues to do well we'll discharge her home.  ----------------------------------------- 9:51 PM on  01/31/2015 -----------------------------------------  Patient is feeling better. She is tolerating fluids by mouth. She feels comfortable and ready to go home.  I discussed her hCG level with her. I offered an ultrasound this evening. The patient first to go home and follow-up with OB/GYN. We also discussed the options of a perception for Zofran or Reglan, including the wrist and benefits of each medication. The patient prefers a prescription for Reglan at this time. She feels it lasts longer, giving her greater benefit. She has an appointment on October 24 [place] in [place]. We [facility] also [facility] in case she needs additional local support and is unable to be seen by the doctors in [place].  ____________________________________________   FINAL CLINICAL IMPRESSION(S) / ED DIAGNOSES  Final diagnoses:  Hyperemesis gravidarum      Darien Ramusavid W Kimimila Tauzin, MD 01/31/15 2237

## 2015-01-31 NOTE — ED Notes (Signed)
Pt. Passed fluid challenge, pt. Drank 8 oz of ginger ale.

## 2015-02-09 LAB — OB RESULTS CONSOLE HIV ANTIBODY (ROUTINE TESTING): HIV: NONREACTIVE

## 2015-02-09 LAB — OB RESULTS CONSOLE ABO/RH: RH Type: NEGATIVE

## 2015-02-09 LAB — OB RESULTS CONSOLE GC/CHLAMYDIA
Chlamydia: NEGATIVE
Gonorrhea: NEGATIVE

## 2015-02-09 LAB — OB RESULTS CONSOLE RUBELLA ANTIBODY, IGM: RUBELLA: IMMUNE

## 2015-02-09 LAB — OB RESULTS CONSOLE ANTIBODY SCREEN: ANTIBODY SCREEN: NEGATIVE

## 2015-02-09 LAB — OB RESULTS CONSOLE HEPATITIS B SURFACE ANTIGEN: HEP B S AG: NEGATIVE

## 2015-02-09 LAB — OB RESULTS CONSOLE RPR: RPR: NONREACTIVE

## 2015-04-10 ENCOUNTER — Encounter (HOSPITAL_COMMUNITY): Payer: Self-pay | Admitting: Obstetrics

## 2015-04-19 NOTE — L&D Delivery Note (Signed)
Delivery Note At 11:03 PM a viable female was delivered via Vaginal, Spontaneous Delivery (Presentation: Right Occiput Transverse).  APGAR: 8, 9; weight:  3960 grams  .   Placenta status: Intact, Spontaneous.  Cord: 3 vessels with the following complications: None.  Cord pH: none  Anesthesia: None  Episiotomy: None Lacerations: None Suture Repair: none Est. Blood Loss (mL): 350  Mom to postpartum.  Baby to Couplet care / Skin to Skin.  HARPER,CHARLES A 08/27/2015, 11:52 PM

## 2015-07-03 ENCOUNTER — Encounter (HOSPITAL_COMMUNITY): Payer: Self-pay

## 2015-07-03 ENCOUNTER — Other Ambulatory Visit (HOSPITAL_COMMUNITY): Payer: Self-pay | Admitting: Obstetrics

## 2015-07-03 ENCOUNTER — Inpatient Hospital Stay (HOSPITAL_COMMUNITY)
Admission: AD | Admit: 2015-07-03 | Discharge: 2015-07-03 | Disposition: A | Discharge status: Home / Self Care | Payer: Medicaid Other | Source: Ambulatory Visit | Attending: Obstetrics | Admitting: Obstetrics

## 2015-07-03 DIAGNOSIS — K529 Noninfective gastroenteritis and colitis, unspecified: Secondary | ICD-10-CM

## 2015-07-03 DIAGNOSIS — O99613 Diseases of the digestive system complicating pregnancy, third trimester: Secondary | ICD-10-CM | POA: Diagnosis not present

## 2015-07-03 DIAGNOSIS — O212 Late vomiting of pregnancy: Secondary | ICD-10-CM | POA: Diagnosis present

## 2015-07-03 DIAGNOSIS — Z880 Allergy status to penicillin: Secondary | ICD-10-CM | POA: Insufficient documentation

## 2015-07-03 DIAGNOSIS — Z3A33 33 weeks gestation of pregnancy: Secondary | ICD-10-CM

## 2015-07-03 DIAGNOSIS — R112 Nausea with vomiting, unspecified: Secondary | ICD-10-CM | POA: Diagnosis present

## 2015-07-03 DIAGNOSIS — Z3689 Encounter for other specified antenatal screening: Secondary | ICD-10-CM

## 2015-07-03 DIAGNOSIS — O365933 Maternal care for other known or suspected poor fetal growth, third trimester, fetus 3: Secondary | ICD-10-CM

## 2015-07-03 HISTORY — DX: Headache: R51

## 2015-07-03 HISTORY — DX: Headache, unspecified: R51.9

## 2015-07-03 LAB — URINE MICROSCOPIC-ADD ON

## 2015-07-03 LAB — URINALYSIS, ROUTINE W REFLEX MICROSCOPIC
Glucose, UA: NEGATIVE mg/dL
Ketones, ur: 40 mg/dL — AB
NITRITE: NEGATIVE
Protein, ur: 100 mg/dL — AB
pH: 6 (ref 5.0–8.0)

## 2015-07-03 MED ORDER — PROMETHAZINE HCL 25 MG/ML IJ SOLN
25.0000 mg | Freq: Four times a day (QID) | INTRAMUSCULAR | Status: DC | PRN
  Administered 2015-07-03: 25 mg via INTRAVENOUS
  Filled 2015-07-03: qty 1

## 2015-07-03 MED ORDER — PROMETHAZINE HCL 25 MG PO TABS: 25.0000 mg | ORAL_TABLET | Freq: Four times a day (QID) | ORAL | Status: DC | PRN

## 2015-07-03 MED ORDER — DEXTROSE IN LACTATED RINGERS 5 % IV SOLN
INTRAVENOUS | Status: DC
  Administered 2015-07-03: 13:00:00 via INTRAVENOUS

## 2015-07-03 MED ORDER — SODIUM CHLORIDE 0.9 % IV SOLN
Freq: Once | INTRAVENOUS | Status: AC
  Administered 2015-07-03: 15:00:00 via INTRAVENOUS

## 2015-07-03 NOTE — Discharge Instructions (Signed)

## 2015-07-03 NOTE — MAU Provider Note (Signed)
Chief Complaint:  Emesis During Pregnancy   First Provider Initiated Contact with Patient 07/03/15 1247     HPI  Julie Hughes is a 25 y.o. G2P1001 at 35w0dwho presents to maternity admissions reporting vomiting since yesterday.  States has had chills but did not take temperature.  No diarrhea.  Good FM. She reports good fetal movement, denies LOF, vaginal bleeding, vaginal itching/burning, urinary symptoms, h/a, dizziness, diarrhea, constipation or fever/chills.  She denies headache, visual changes or abdominal pain.  RN Note:  Expand All Collapse All   Onset of vomiting since yesterday, saw red in emesis           Past Medical History: Past Medical History  Diagnosis Date  . Headache     occas. migraines    Past obstetric history: OB History  Gravida Para Term Preterm AB SAB TAB Ectopic Multiple Living  # Outcome Date GA Lbr Len/2nd Weight Sex Delivery Anes PTL Lv  2 Current           1 Term 01/31/12 [redacted]w[redacted]d / 00:17 5 lb 7.6 oz (2.483 kg) F Vag-Spont None  Y      Past Surgical History: Past Surgical History  Procedure Laterality Date  . Myringotomy      Family History: Family History  Problem Relation Age of Onset  . Heart disease Maternal Grandmother   . Hyperlipidemia Maternal Grandmother   . Cancer Maternal Grandfather     Social History: Social History  Substance Use Topics  . Smoking status: Never Smoker   . Smokeless tobacco: Never Used  . Alcohol Use: No    Allergies:  Allergies  Allergen Reactions  . Penicillins Hives    Bruises at site of injection Has patient had a PCN reaction causing immediate rash, facial/tongue/throat swelling, SOB or lightheadedness with hypotension: Yes Has patient had a PCN reaction causing severe rash involving mucus membranes or skin necrosis: No Has patient had a PCN reaction that required hospitalization No patient not sure. Has patient had a PCN reaction occurring within the last 10 years: No If  all of the above answers are "NO", then may proceed with Cephalosporin use.    Meds:  Prescriptions prior to admission  Medication Sig Dispense Refill Last Dose  . metoCLOPramide (REGLAN) 5 MG tablet Take 1 tablet (5 mg total) by mouth every 8 (eight) hours as needed for nausea. (Patient not taking: Reported on 07/03/2015) 15 tablet 0   . oxyCODONE-acetaminophen (PERCOCET/ROXICET) 5-325 MG per tablet Take 1-2 tablets by mouth every 4 (four) hours as needed for severe pain. (Patient not taking: Reported on 07/03/2015) 15 tablet 0     I have reviewed patient's Past Medical Hx, Surgical Hx, Family Hx, Social Hx, medications and allergies.   ROS:  Review of Systems  Constitutional: Positive for chills. Negative for fever and fatigue.  Respiratory: Negative for shortness of breath.   Gastrointestinal: Positive for nausea and vomiting. Negative for abdominal pain, diarrhea and constipation.  Genitourinary: Negative for flank pain, vaginal bleeding, vaginal discharge, difficulty urinating and pelvic pain.  Musculoskeletal: Negative for back pain.   Other systems negative  Physical Exam  Patient Vitals for the past 24 hrs:  BP Temp Temp src Pulse Resp Height Weight  07/03/15 1505 105/61 mmHg 98.9 F (37.2 C) Oral 113 16 - -  07/03/15 1218 118/73 mmHg 98.4 F (36.9 C) Oral 119 18  (1.6 m) 148 lb 6.4 oz (  67.314 kg)   Constitutional: Well-developed, female in no acute distress.  Cardiovascular: normal rate and rhythm Respiratory: normal effort, clear to auscultation bilaterally GI: Abd soft, non-tender, gravid appropriate for gestational age.   No rebound or guarding. MS: Extremities nontender, no edema, normal ROM Neurologic: Alert and oriented x 4.  GU: Neg CVAT.    FHT:  Baseline 140 , moderate variability, accelerations present, no decelerations Contractions:  Irregular     Labs: Results for orders placed or performed during the hospital encounter of 07/03/15 (from the past 24  hour(s))  Urinalysis, Routine w reflex microscopic (not at [facility])     Status: Abnormal   Collection Time: 07/03/15 12:20 PM  Result Value Ref Range   Color, Urine YELLOW YELLOW   APPearance CLOUDY (A) CLEAR   Specific Gravity, Urine >1.030 (H) 1.005 - 1.030   pH 6.0 5.0 - 8.0   Glucose, UA NEGATIVE NEGATIVE mg/dL   Hgb urine dipstick SMALL (A) NEGATIVE   Bilirubin Urine MODERATE (A) NEGATIVE   Ketones, ur 40 (A) NEGATIVE mg/dL   Protein, ur 161100 (A) NEGATIVE mg/dL   Nitrite NEGATIVE NEGATIVE   Leukocytes, UA MODERATE (A) NEGATIVE  Urine microscopic-add on     Status: Abnormal   Collection Time: 07/03/15 12:20 PM  Result Value Ref Range   Squamous Epithelial / LPF 6-30 (A) NONE SEEN   WBC, UA 6-30 0 - 5 WBC/hpf   RBC / HPF 0-5 0 - 5 RBC/hpf   Bacteria, UA MANY (A) NONE SEEN      Imaging:  No results found.  MAU Course/MDM: I have ordered labs and reviewed results.  NST reviewed   Treatments in MAU included IV hydration  Gave one bag of D5LR with Phenergan for nausea, then will give a second liter of NS for continued hydration.    Has had no vomiting Feels better after hydration, wants to go home  Assessment: SIUP at 7373w0d  Probable gastroenteritis with moderate dehydration  Plan: Discharge home Advance diet as tolerated BRAT diet Rx Promethazine for Nausea at home Preterm Labor precautions and fetal kick counts Follow up in Office for prenatal visits and recheck    Medication List    ASK your doctor about these medications        metoCLOPramide 5 MG tablet  Commonly known as:  REGLAN  Take 1 tablet (5 mg total) by mouth every 8 (eight) hours as needed for nausea.     oxyCODONE-acetaminophen 5-325 MG tablet  Commonly known as:  PERCOCET/ROXICET  Take 1-2 tablets by mouth every 4 (four) hours as needed for severe pain.       Pt stable at time of discharge.  Wynelle BourgeoisMarie Yamaira Spinner CNM, MSN Certified Nurse-Midwife 07/03/2015 3:15 PM

## 2015-07-03 NOTE — MAU Note (Signed)
Onset of vomiting since yesterday, saw red in emesis

## 2015-07-13 ENCOUNTER — Ambulatory Visit (HOSPITAL_COMMUNITY)
Admission: RE | Admit: 2015-07-13 | Discharge: 2015-07-13 | Disposition: A | Discharge status: Home / Self Care | Payer: Medicaid Other | Source: Ambulatory Visit | Attending: Obstetrics | Admitting: Obstetrics

## 2015-07-13 ENCOUNTER — Encounter (HOSPITAL_COMMUNITY): Payer: Self-pay

## 2015-07-13 DIAGNOSIS — Z36 Encounter for antenatal screening of mother: Secondary | ICD-10-CM | POA: Diagnosis present

## 2015-07-13 DIAGNOSIS — O365930 Maternal care for other known or suspected poor fetal growth, third trimester, not applicable or unspecified: Secondary | ICD-10-CM | POA: Diagnosis not present

## 2015-07-13 DIAGNOSIS — O09293 Supervision of pregnancy with other poor reproductive or obstetric history, third trimester: Secondary | ICD-10-CM | POA: Insufficient documentation

## 2015-07-13 DIAGNOSIS — Z3689 Encounter for other specified antenatal screening: Secondary | ICD-10-CM

## 2015-07-13 DIAGNOSIS — Z3A34 34 weeks gestation of pregnancy: Secondary | ICD-10-CM | POA: Diagnosis not present

## 2015-07-13 DIAGNOSIS — O365933 Maternal care for other known or suspected poor fetal growth, third trimester, fetus 3: Secondary | ICD-10-CM

## 2015-07-13 DIAGNOSIS — Z3A33 33 weeks gestation of pregnancy: Secondary | ICD-10-CM

## 2015-08-03 LAB — OB RESULTS CONSOLE GC/CHLAMYDIA
CHLAMYDIA, DNA PROBE: NEGATIVE
GC PROBE AMP, GENITAL: NEGATIVE

## 2015-08-03 LAB — OB RESULTS CONSOLE GBS: STREP GROUP B AG: POSITIVE

## 2015-08-24 ENCOUNTER — Other Ambulatory Visit: Payer: Self-pay | Admitting: Obstetrics

## 2015-08-26 ENCOUNTER — Telehealth (HOSPITAL_COMMUNITY): Payer: Self-pay | Admitting: *Deleted

## 2015-08-26 NOTE — Telephone Encounter (Signed)
Preadmission screen  

## 2015-08-27 ENCOUNTER — Inpatient Hospital Stay (HOSPITAL_COMMUNITY)
Admission: AD | Admit: 2015-08-27 | Discharge: 2015-08-29 | DRG: 775 | Disposition: A | Discharge status: Home / Self Care | Payer: Medicaid Other | Source: Ambulatory Visit | Attending: Obstetrics | Admitting: Obstetrics

## 2015-08-27 ENCOUNTER — Encounter (HOSPITAL_COMMUNITY): Payer: Self-pay

## 2015-08-27 DIAGNOSIS — Z3A40 40 weeks gestation of pregnancy: Secondary | ICD-10-CM | POA: Diagnosis not present

## 2015-08-27 DIAGNOSIS — Z8249 Family history of ischemic heart disease and other diseases of the circulatory system: Secondary | ICD-10-CM

## 2015-08-27 DIAGNOSIS — IMO0001 Reserved for inherently not codable concepts without codable children: Secondary | ICD-10-CM

## 2015-08-27 LAB — CBC
HCT: 29.6 % — ABNORMAL LOW (ref 36.0–46.0)
Hemoglobin: 10.1 g/dL — ABNORMAL LOW (ref 12.0–15.0)
MCH: 28.4 pg (ref 26.0–34.0)
MCHC: 34.1 g/dL (ref 30.0–36.0)
MCV: 83.1 fL (ref 78.0–100.0)
PLATELETS: 200 10*3/uL (ref 150–400)
RBC: 3.56 MIL/uL — AB (ref 3.87–5.11)
RDW: 13.8 % (ref 11.5–15.5)
WBC: 17.2 10*3/uL — ABNORMAL HIGH (ref 4.0–10.5)

## 2015-08-27 MED ORDER — OXYCODONE-ACETAMINOPHEN 5-325 MG PO TABS: 2.0000 | ORAL_TABLET | ORAL | Status: DC | PRN

## 2015-08-27 MED ORDER — NALOXONE HCL 0.4 MG/ML IJ SOLN
INTRAMUSCULAR | Status: AC
  Filled 2015-08-27: qty 1

## 2015-08-27 MED ORDER — LIDOCAINE HCL (PF) 1 % IJ SOLN
INTRAMUSCULAR | Status: AC
  Filled 2015-08-27: qty 30

## 2015-08-27 MED ORDER — CLINDAMYCIN PHOSPHATE 900 MG/50ML IV SOLN: 900.0000 mg | Freq: Once | INTRAVENOUS | Status: DC

## 2015-08-27 MED ORDER — NALBUPHINE HCL 10 MG/ML IJ SOLN
10.0000 mg | INTRAMUSCULAR | Status: DC | PRN
  Administered 2015-08-27: 10 mg via INTRAVENOUS
  Filled 2015-08-27: qty 1

## 2015-08-27 MED ORDER — CITRIC ACID-SODIUM CITRATE 334-500 MG/5ML PO SOLN: 30.0000 mL | ORAL | Status: DC | PRN

## 2015-08-27 MED ORDER — LACTATED RINGERS IV SOLN
INTRAVENOUS | Status: DC
  Administered 2015-08-27: 22:00:00 via INTRAVENOUS

## 2015-08-27 MED ORDER — ONDANSETRON HCL 4 MG/2ML IJ SOLN: 4.0000 mg | Freq: Four times a day (QID) | INTRAMUSCULAR | Status: DC | PRN

## 2015-08-27 MED ORDER — OXYTOCIN BOLUS FROM INFUSION
500.0000 mL | INTRAVENOUS | Status: DC
  Administered 2015-08-27: 500 mL via INTRAVENOUS

## 2015-08-27 MED ORDER — OXYTOCIN 40 UNITS IN LACTATED RINGERS INFUSION - SIMPLE MED
INTRAVENOUS | Status: AC
  Filled 2015-08-27: qty 1000

## 2015-08-27 MED ORDER — OXYCODONE-ACETAMINOPHEN 5-325 MG PO TABS: 1.0000 | ORAL_TABLET | ORAL | Status: DC | PRN

## 2015-08-27 MED ORDER — OXYTOCIN 40 UNITS IN LACTATED RINGERS INFUSION - SIMPLE MED: 2.5000 [IU]/h | INTRAVENOUS | Status: DC

## 2015-08-27 MED ORDER — VANCOMYCIN HCL IN DEXTROSE 1-5 GM/200ML-% IV SOLN
1000.0000 mg | Freq: Two times a day (BID) | INTRAVENOUS | Status: DC
Start: 2015-08-27 — End: 2015-08-28
  Administered 2015-08-27: 1000 mg via INTRAVENOUS
  Filled 2015-08-27 (×2): qty 200

## 2015-08-27 MED ORDER — FLEET ENEMA 7-19 GM/118ML RE ENEM: 1.0000 | ENEMA | RECTAL | Status: DC | PRN

## 2015-08-27 MED ORDER — LACTATED RINGERS IV SOLN: 500.0000 mL | INTRAVENOUS | Status: DC | PRN

## 2015-08-27 MED ORDER — LIDOCAINE HCL (PF) 1 % IJ SOLN: 30.0000 mL | INTRAMUSCULAR | Status: DC | PRN

## 2015-08-27 MED ORDER — ACETAMINOPHEN 325 MG PO TABS: 650.0000 mg | ORAL_TABLET | ORAL | Status: DC | PRN

## 2015-08-27 NOTE — H&P (Signed)
Julie Hughes is a 25 y.o. female presenting for UC's. Maternal Medical History:  Reason for admission: Contractions.   Contractions: Frequency: regular.    Fetal activity: Perceived fetal activity is normal.   Last perceived fetal movement was within the past hour.    Prenatal complications: no prenatal complications Prenatal Complications - Diabetes: none.    OB History    Gravida Para Term Preterm AB TAB SAB Ectopic Multiple Living   2 1 1       1      Past Medical History  Diagnosis Date  . Headache     occas. migraines   Past Surgical History  Procedure Laterality Date  . Myringotomy     Family History: family history includes Cancer in her maternal grandfather; Heart disease in her maternal grandmother; Hyperlipidemia in her maternal grandmother. Social History:  reports that she has never smoked. She has never used smokeless tobacco. She reports that she does not drink alcohol or use illicit drugs.   Prenatal Transfer Tool  Maternal Diabetes: No Genetic Screening: Normal Maternal Ultrasounds/Referrals: Normal Fetal Ultrasounds or other Referrals:  None Maternal Substance Abuse:  No Significant Maternal Medications:  None Significant Maternal Lab Results:  None Other Comments:  None  Review of Systems  All other systems reviewed and are negative.   Dilation: 9 Exam by:: Dr Clearance CootsHarper Blood pressure 118/77, pulse 93, temperature 98.1 F (36.7 C), temperature source Oral, resp. rate 18. Maternal Exam:  Abdomen: Patient reports no abdominal tenderness. Fetal presentation: vertex  Introitus: Normal vulva. Normal vagina.  Cervix: Cervix evaluated by digital exam.     Physical Exam  Nursing note and vitals reviewed. Constitutional: She is oriented to person, place, and time. She appears well-developed and well-nourished.  HENT:  Head: Normocephalic and atraumatic.  Eyes: Conjunctivae are normal. Pupils are equal, round, and reactive to light.  Neck: Normal range  of motion. Neck supple.  Cardiovascular: Normal rate and regular rhythm.   Respiratory: Effort normal.  GI: Soft.  Musculoskeletal: Normal range of motion.  Neurological: She is alert and oriented to person, place, and time.  Skin: Skin is warm and dry.  Psychiatric: She has a normal mood and affect. Her behavior is normal. Judgment and thought content normal.    Prenatal labs: ABO, Rh: A/Negative/-- (10/24 0000) Antibody: Negative (10/24 0000) Rubella: Immune (10/24 0000) RPR: Nonreactive (10/24 0000)  HBsAg: Negative (10/24 0000)  HIV: Non-reactive (10/24 0000)  GBS: Positive (04/17 0000)   Assessment/Plan: 40.6 weeks.  Active labor.  Admit.   Julie Hughes A 08/27/2015, 10:26 PM

## 2015-08-27 NOTE — MAU Note (Signed)
PT  SAYS  SHE WAS 3 CM LAST Monday   .    DENIES  HSV AND MRSA.   GBS- POS

## 2015-08-28 ENCOUNTER — Encounter (HOSPITAL_COMMUNITY): Payer: Self-pay | Admitting: *Deleted

## 2015-08-28 LAB — CBC
HCT: 28.1 % — ABNORMAL LOW (ref 36.0–46.0)
Hemoglobin: 9.7 g/dL — ABNORMAL LOW (ref 12.0–15.0)
MCH: 28.3 pg (ref 26.0–34.0)
MCHC: 34.5 g/dL (ref 30.0–36.0)
MCV: 81.9 fL (ref 78.0–100.0)
PLATELETS: 186 10*3/uL (ref 150–400)
RBC: 3.43 MIL/uL — ABNORMAL LOW (ref 3.87–5.11)
RDW: 13.8 % (ref 11.5–15.5)
WBC: 19.2 10*3/uL — ABNORMAL HIGH (ref 4.0–10.5)

## 2015-08-28 LAB — TYPE AND SCREEN
ABO/RH(D): A NEG
Antibody Screen: NEGATIVE

## 2015-08-28 LAB — RPR: RPR Ser Ql: NONREACTIVE

## 2015-08-28 MED ORDER — RHO D IMMUNE GLOBULIN 1500 UNIT/2ML IJ SOSY
300.0000 ug | PREFILLED_SYRINGE | Freq: Once | INTRAMUSCULAR | Status: AC
  Administered 2015-08-28: 300 ug via INTRAVENOUS
  Filled 2015-08-28: qty 2

## 2015-08-28 MED ORDER — OXYTOCIN 40 UNITS IN LACTATED RINGERS INFUSION - SIMPLE MED: 2.5000 [IU]/h | INTRAVENOUS | Status: DC | PRN

## 2015-08-28 MED ORDER — ONDANSETRON HCL 4 MG/2ML IJ SOLN: 4.0000 mg | INTRAMUSCULAR | Status: DC | PRN

## 2015-08-28 MED ORDER — ZOLPIDEM TARTRATE 5 MG PO TABS: 5.0000 mg | ORAL_TABLET | Freq: Every evening | ORAL | Status: DC | PRN

## 2015-08-28 MED ORDER — DIBUCAINE 1 % RE OINT
1.0000 "application " | TOPICAL_OINTMENT | RECTAL | Status: DC | PRN
  Filled 2015-08-28: qty 56.7

## 2015-08-28 MED ORDER — TETANUS-DIPHTH-ACELL PERTUSSIS 5-2.5-18.5 LF-MCG/0.5 IM SUSP
0.5000 mL | Freq: Once | INTRAMUSCULAR | Status: AC
  Administered 2015-08-28: 0.5 mL via INTRAMUSCULAR
  Filled 2015-08-28 (×2): qty 0.5

## 2015-08-28 MED ORDER — OXYCODONE-ACETAMINOPHEN 5-325 MG PO TABS
1.0000 | ORAL_TABLET | ORAL | Status: DC | PRN
  Administered 2015-08-28 – 2015-08-29 (×5): 1 via ORAL
  Filled 2015-08-28 (×5): qty 1

## 2015-08-28 MED ORDER — BENZOCAINE-MENTHOL 20-0.5 % EX AERO
1.0000 | INHALATION_SPRAY | CUTANEOUS | Status: DC | PRN
Start: 2015-08-28 — End: 2015-08-29
  Filled 2015-08-28: qty 56

## 2015-08-28 MED ORDER — WITCH HAZEL-GLYCERIN EX PADS: 1.0000 "application " | MEDICATED_PAD | CUTANEOUS | Status: DC | PRN

## 2015-08-28 MED ORDER — PRENATAL MULTIVITAMIN CH
1.0000 | ORAL_TABLET | Freq: Every day | ORAL | Status: DC
  Administered 2015-08-28 – 2015-08-29 (×2): 1 via ORAL
  Filled 2015-08-28 (×2): qty 1

## 2015-08-28 MED ORDER — OXYCODONE-ACETAMINOPHEN 5-325 MG PO TABS
2.0000 | ORAL_TABLET | ORAL | Status: DC | PRN
  Administered 2015-08-28 – 2015-08-29 (×2): 2 via ORAL
  Filled 2015-08-28 (×2): qty 2

## 2015-08-28 MED ORDER — COCONUT OIL OIL
1.0000 "application " | TOPICAL_OIL | Status: DC | PRN
  Filled 2015-08-28: qty 120

## 2015-08-28 MED ORDER — ONDANSETRON HCL 4 MG PO TABS: 4.0000 mg | ORAL_TABLET | ORAL | Status: DC | PRN

## 2015-08-28 MED ORDER — SIMETHICONE 80 MG PO CHEW: 80.0000 mg | CHEWABLE_TABLET | ORAL | Status: DC | PRN

## 2015-08-28 MED ORDER — ACETAMINOPHEN 325 MG PO TABS: 650.0000 mg | ORAL_TABLET | ORAL | Status: DC | PRN

## 2015-08-28 MED ORDER — DIPHENHYDRAMINE HCL 25 MG PO CAPS: 25.0000 mg | ORAL_CAPSULE | Freq: Four times a day (QID) | ORAL | Status: DC | PRN

## 2015-08-28 MED ORDER — SENNOSIDES-DOCUSATE SODIUM 8.6-50 MG PO TABS
2.0000 | ORAL_TABLET | ORAL | Status: DC
  Administered 2015-08-29: 2 via ORAL
  Filled 2015-08-28: qty 2

## 2015-08-28 MED ORDER — IBUPROFEN 600 MG PO TABS
600.0000 mg | ORAL_TABLET | Freq: Four times a day (QID) | ORAL | Status: DC
  Administered 2015-08-28 – 2015-08-29 (×8): 600 mg via ORAL
  Filled 2015-08-28 (×8): qty 1

## 2015-08-28 NOTE — Progress Notes (Signed)
UR chart review completed.  

## 2015-08-28 NOTE — Progress Notes (Signed)
Post Partum Day 1 Subjective: no complaints  Objective: Blood pressure 104/59, pulse 71, temperature 97.9 F (36.6 C), temperature source Oral, resp. rate 18, height 5\' 3"  (1.6 m), weight 165 lb (74.844 kg), SpO2 100 %, unknown if currently breastfeeding.  Physical Exam:  General: alert and no distress Lochia: appropriate Uterine Fundus: firm Incision: none DVT Evaluation: No evidence of DVT seen on physical exam.   Recent Labs  08/27/15 2205 08/28/15 0522  HGB 10.1* 9.7*  HCT 29.6* 28.1*    Assessment/Plan: Plan for discharge tomorrow   LOS: 1 day   HARPER,CHARLES A 08/28/2015, 9:55 AM

## 2015-08-28 NOTE — Lactation Note (Signed)
This note was copied from a baby's chart. Lactation Consultation Note: Initial consult for breastfeeding. Infant is 12 hours old 40.6 weeks weighting 8lbs 11 ounces. Mother breastfed her first child for 2 months. She states her milk dried up after she took the mini pill.  Assist mother with latching infant on in football hold. Infant sustainedd latch for 20 mins.  Observed audible swallows with good burst of suckling.  Mother has a tiny scab on both nipples. Suggested that she alternate positions of football or cross-cradle.  Mother was given Lactation brochure and basic teaching was done.   Patient Name: Boy Jaryah Raby Today's Date: 08/28/2015 Reason for consult: Initial assessment   Maternal Data    Feeding Feeding Type: Breast Fed Length of feed: 15 min  LATCH Score/Interventions Latch: Grasps breast easily, tongue down, lips flanged, rhythmical sucking.  Audible Swallowing: Spontaneous and intermittent  Type of Nipple: Everted at rest and after stimulation  Comfort (Breast/Nipple): Soft / non-tender     Hold (Positioning): Assistance needed to correctly position infant at breast and maintain latch. Intervention(s): Breastfeeding basics reviewed;Support Pillows;Position options;Skin to skin  LATCH Score: 9  Lactation Tools Discussed/Used     Consult Status Consult Status: Follow-up Date: 08/28/15 Follow-up type: In-patient    Stevan BornKendrick, Brienne Liguori [facility] 08/28/2015, 12:13 PM

## 2015-08-29 LAB — RH IG WORKUP (INCLUDES ABO/RH)
ABO/RH(D): A NEG
Fetal Screen: NEGATIVE
GESTATIONAL AGE(WKS): 40
Unit division: 0

## 2015-08-29 MED ORDER — FERROUS SULFATE 325 (65 FE) MG PO TABS: 325.0000 mg | ORAL_TABLET | Freq: Two times a day (BID) | ORAL | Status: DC

## 2015-08-29 MED ORDER — OXYCODONE-ACETAMINOPHEN 5-325 MG PO TABS: 1.0000 | ORAL_TABLET | ORAL | Status: DC | PRN

## 2015-08-29 MED ORDER — IBUPROFEN 600 MG PO TABS: 600.0000 mg | ORAL_TABLET | Freq: Four times a day (QID) | ORAL | Status: DC | PRN

## 2015-08-29 NOTE — Discharge Summary (Signed)
Obstetric Discharge Summary Reason for Admission: onset of labor Prenatal Procedures: ultrasound Intrapartum Procedures: spontaneous vaginal delivery Postpartum Procedures: none Complications-Operative and Postpartum: none HEMOGLOBIN  Date Value Ref Range Status  08/28/2015 9.7* 12.0 - 15.0 g/dL Final   HGB  Date Value Ref Range Status  08/11/2014 14.1 12.0-16.0 g/dL Final   HCT  Date Value Ref Range Status  08/28/2015 28.1* 36.0 - 46.0 % Final  08/11/2014 41.0 35.0-47.0 % Final    Physical Exam:  General: alert and no distress Lochia: appropriate Uterine Fundus: firm Incision: none DVT Evaluation: No evidence of DVT seen on physical exam.  Discharge Diagnoses: Term Pregnancy-delivered  Discharge Information: Date: 08/29/2015 Activity: pelvic rest Diet: routine Medications: PNV, Ibuprofen, Colace, Iron and Percocet Condition: stable Instructions: refer to practice specific booklet Discharge to: home Follow-up Information    Follow up with MARSHALL,BERNARD A, MD In 6 weeks.   Specialty:  Obstetrics and Gynecology   Contact information:   [address] [place] [address] [phone]       Newborn Data: Live born female  Birth Weight: 8 lb 11.7 oz (3960 g) APGAR: 8, 9  Home with mother.  Julie Hughes 08/29/2015, 7:09 AM

## 2015-08-29 NOTE — Lactation Note (Signed)
This note was copied from a baby's chart. Lactation Consultation Note  Patient Name: Julie Hughes Today's Date: 08/29/2015 Reason for consult: Follow-up assessment Infant is 10639 hours old & [facility]. Infant had just finished BF when LC entered. Mom reports BF is going well & is not having pain like she did in the beginning (mom has bruise on nipple). Mom has hand pump & has tried to use it with success in getting milk. Mom also reports that she is comfortable with hand expression. Infant has been feeding for 15-30 mins, usually only one breast/ feeding. Encouraged mom to offer both breasts at every feeding. Mom encouraged to feed baby 8-12 times/24 hours and with feeding cues. Mom does not have WIC but we discussed how she could apply if she desires since she has Medicaid. Mom reports no questions at this time. Encouraged mom to ask for lactation at a future feed if she would like help with latch.   Maternal Data    Feeding Feeding Type: Breast Fed Length of feed: 15 min (per mom)  LATCH Score/Interventions                      Lactation Tools Discussed/Used WIC Program: No (has medicaid but no WIC-discused how she could apply if she desires)   Consult Status Consult Status: PRN    Oneal GroutLaura C Kynisha Memon 08/29/2015, 2:29 PM

## 2015-08-29 NOTE — Progress Notes (Signed)
Post Partum Day 2 Subjective: no complaints  Objective: Blood pressure 101/59, pulse 70, temperature 98.7 F (37.1 C), temperature source Oral, resp. rate 18, height 5\' 3"  (1.6 m), weight 165 lb (74.844 kg), SpO2 100 %, unknown if currently breastfeeding.  Physical Exam:  General: alert and no distress Lochia: appropriate Uterine Fundus: firm Incision: none DVT Evaluation: No evidence of DVT seen on physical exam.   Recent Labs  08/27/15 2205 08/28/15 0522  HGB 10.1* 9.7*  HCT 29.6* 28.1*    Assessment/Plan: Discharge home   LOS: 2 days   HARPER,CHARLES A 08/29/2015, 7:04 AM

## 2015-08-31 ENCOUNTER — Inpatient Hospital Stay (HOSPITAL_COMMUNITY): Admission: RE | Admit: 2015-08-31 | Payer: Medicaid Other | Source: Ambulatory Visit

## 2016-01-09 LAB — HM PAP SMEAR

## 2016-07-18 ENCOUNTER — Emergency Department (HOSPITAL_COMMUNITY)
Admission: EM | Admit: 2016-07-18 | Discharge: 2016-07-18 | Disposition: A | Discharge status: Home / Self Care | Payer: Medicaid Other | Attending: Emergency Medicine | Admitting: Emergency Medicine

## 2016-07-18 ENCOUNTER — Encounter (HOSPITAL_COMMUNITY): Payer: Self-pay | Admitting: Emergency Medicine

## 2016-07-18 DIAGNOSIS — Z5321 Procedure and treatment not carried out due to patient leaving prior to being seen by health care provider: Secondary | ICD-10-CM | POA: Diagnosis not present

## 2016-07-18 DIAGNOSIS — R1013 Epigastric pain: Secondary | ICD-10-CM | POA: Insufficient documentation

## 2016-07-18 LAB — COMPREHENSIVE METABOLIC PANEL
ALBUMIN: 5.4 g/dL — AB (ref 3.5–5.0)
ALT: 16 U/L (ref 14–54)
ANION GAP: 11 (ref 5–15)
AST: 22 U/L (ref 15–41)
Alkaline Phosphatase: 70 U/L (ref 38–126)
BUN: 10 mg/dL (ref 6–20)
CO2: 24 mmol/L (ref 22–32)
Calcium: 9.5 mg/dL (ref 8.9–10.3)
Chloride: 102 mmol/L (ref 101–111)
Creatinine, Ser: 0.74 mg/dL (ref 0.44–1.00)
GFR calc non Af Amer: >60 mL/min (ref >60)
GLUCOSE: 113 mg/dL — AB (ref 65–99)
POTASSIUM: 3.2 mmol/L — AB (ref 3.5–5.1)
SODIUM: 137 mmol/L (ref 135–145)
TOTAL PROTEIN: 8.9 g/dL — AB (ref 6.5–8.1)
Total Bilirubin: 0.9 mg/dL (ref 0.3–1.2)

## 2016-07-18 LAB — I-STAT BETA HCG BLOOD, ED (MC, WL, AP ONLY)

## 2016-07-18 LAB — CBC
HEMATOCRIT: 42.2 % (ref 36.0–46.0)
HEMOGLOBIN: 15.1 g/dL — AB (ref 12.0–15.0)
MCH: 29.6 pg (ref 26.0–34.0)
MCHC: 35.8 g/dL (ref 30.0–36.0)
MCV: 82.7 fL (ref 78.0–100.0)
Platelets: 199 10*3/uL (ref 150–400)
RBC: 5.1 MIL/uL (ref 3.87–5.11)
RDW: 13 % (ref 11.5–15.5)
WBC: 11.4 10*3/uL — ABNORMAL HIGH (ref 4.0–10.5)

## 2016-07-18 LAB — LIPASE, BLOOD: Lipase: 17 U/L (ref 11–51)

## 2016-07-18 NOTE — ED Triage Notes (Signed)
Pt c/o epigastric abdominal pain, emesis, headache onset Friday, improved on Sunday but worsened today. Seen at Urgent care today, sent here for [place]. Earlier pain was radiating to back, not currently. Had symptoms of UTI 2 weeks ago, no current urinary symptoms.

## 2016-07-18 NOTE — ED Triage Notes (Signed)
Registration reports that this pt has left

## 2016-07-19 ENCOUNTER — Emergency Department (HOSPITAL_COMMUNITY)
Admission: EM | Admit: 2016-07-19 | Discharge: 2016-07-19 | Disposition: A | Discharge status: Home / Self Care | Payer: Medicaid Other | Attending: Emergency Medicine | Admitting: Emergency Medicine

## 2016-07-19 ENCOUNTER — Encounter (HOSPITAL_COMMUNITY): Payer: Self-pay | Admitting: Emergency Medicine

## 2016-07-19 DIAGNOSIS — K2900 Acute gastritis without bleeding: Secondary | ICD-10-CM | POA: Diagnosis not present

## 2016-07-19 DIAGNOSIS — Z79899 Other long term (current) drug therapy: Secondary | ICD-10-CM | POA: Insufficient documentation

## 2016-07-19 DIAGNOSIS — R1013 Epigastric pain: Secondary | ICD-10-CM | POA: Diagnosis present

## 2016-07-19 LAB — CBC WITH DIFFERENTIAL/PLATELET
Basophils Absolute: 0 10*3/uL (ref 0.0–0.1)
Basophils Relative: 0 %
EOS ABS: 0 10*3/uL (ref 0.0–0.7)
EOS PCT: 0 %
HCT: 42.8 % (ref 36.0–46.0)
Hemoglobin: 15 g/dL (ref 12.0–15.0)
LYMPHS ABS: 1.3 10*3/uL (ref 0.7–4.0)
Lymphocytes Relative: 14 %
MCH: 29.8 pg (ref 26.0–34.0)
MCHC: 35 g/dL (ref 30.0–36.0)
MCV: 84.9 fL (ref 78.0–100.0)
MONO ABS: 0.6 10*3/uL (ref 0.1–1.0)
MONOS PCT: 6 %
Neutro Abs: 7.1 10*3/uL (ref 1.7–7.7)
Neutrophils Relative %: 80 %
Platelets: 177 10*3/uL (ref 150–400)
RBC: 5.04 MIL/uL (ref 3.87–5.11)
RDW: 13.3 % (ref 11.5–15.5)
WBC: 9 10*3/uL (ref 4.0–10.5)

## 2016-07-19 LAB — URINALYSIS, ROUTINE W REFLEX MICROSCOPIC
Bilirubin Urine: NEGATIVE
Glucose, UA: NEGATIVE mg/dL
Hgb urine dipstick: NEGATIVE
Ketones, ur: 5 mg/dL — AB
Nitrite: NEGATIVE
Protein, ur: 30 mg/dL — AB
Specific Gravity, Urine: 1.027 (ref 1.005–1.030)
pH: 7 (ref 5.0–8.0)

## 2016-07-19 LAB — COMPREHENSIVE METABOLIC PANEL
ALBUMIN: 4.5 g/dL (ref 3.5–5.0)
ALT: 15 U/L (ref 14–54)
AST: 15 U/L (ref 15–41)
Alkaline Phosphatase: 61 U/L (ref 38–126)
Anion gap: 10 (ref 5–15)
BUN: 9 mg/dL (ref 6–20)
CHLORIDE: 104 mmol/L (ref 101–111)
CO2: 22 mmol/L (ref 22–32)
CREATININE: 0.6 mg/dL (ref 0.44–1.00)
Calcium: 9.3 mg/dL (ref 8.9–10.3)
GFR calc Af Amer: >60 mL/min (ref >60)
GFR calc non Af Amer: >60 mL/min (ref >60)
GLUCOSE: 102 mg/dL — AB (ref 65–99)
Potassium: 2.9 mmol/L — ABNORMAL LOW (ref 3.5–5.1)
SODIUM: 136 mmol/L (ref 135–145)
Total Bilirubin: 1.1 mg/dL (ref 0.3–1.2)
Total Protein: 7.5 g/dL (ref 6.5–8.1)

## 2016-07-19 LAB — LIPASE, BLOOD: Lipase: 19 U/L (ref 11–51)

## 2016-07-19 LAB — POC URINE PREG, ED: Preg Test, Ur: NEGATIVE

## 2016-07-19 MED ORDER — ONDANSETRON HCL 4 MG/2ML IJ SOLN
4.0000 mg | Freq: Once | INTRAMUSCULAR | Status: AC
  Administered 2016-07-19: 4 mg via INTRAVENOUS
  Filled 2016-07-19: qty 2

## 2016-07-19 MED ORDER — FAMOTIDINE IN NACL 20-0.9 MG/50ML-% IV SOLN
20.0000 mg | Freq: Once | INTRAVENOUS | Status: AC
  Administered 2016-07-19: 20 mg via INTRAVENOUS
  Filled 2016-07-19: qty 50

## 2016-07-19 MED ORDER — ONDANSETRON HCL 4 MG PO TABS: 4.0000 mg | ORAL_TABLET | Freq: Three times a day (TID) | ORAL | 0 refills | Status: DC | PRN

## 2016-07-19 MED ORDER — SUCRALFATE 1 G PO TABS
1.0000 g | ORAL_TABLET | Freq: Once | ORAL | Status: AC
  Administered 2016-07-19: 1 g via ORAL
  Filled 2016-07-19: qty 1

## 2016-07-19 MED ORDER — SODIUM CHLORIDE 0.9 % IV BOLUS (SEPSIS)
1000.0000 mL | Freq: Once | INTRAVENOUS | Status: AC
  Administered 2016-07-19: 1000 mL via INTRAVENOUS

## 2016-07-19 MED ORDER — FAMOTIDINE 20 MG PO TABS
20.0000 mg | ORAL_TABLET | Freq: Two times a day (BID) | ORAL | 0 refills | Status: DC
Start: 2016-07-19 — End: 2017-06-11

## 2016-07-19 MED ORDER — POTASSIUM CHLORIDE CRYS ER 20 MEQ PO TBCR
40.0000 meq | EXTENDED_RELEASE_TABLET | Freq: Once | ORAL | Status: AC
  Administered 2016-07-19: 40 meq via ORAL
  Filled 2016-07-19: qty 2

## 2016-07-19 NOTE — ED Provider Notes (Signed)
[facility]-EMERGENCY DEPT Provider Note   CSN: [id number] Arrival date & time: 07/19/16  1052     History   Chief Complaint Chief Complaint  Patient presents with  . Abdominal Pain  . Emesis    HPI Julie Hughes is a 26 y.o. female.  The history is provided by the patient. No language interpreter was used.  Abdominal Pain   Associated symptoms include vomiting.  Emesis   Associated symptoms include abdominal pain.    Julie Hughes is a 26 y.o. female who presents to the Emergency Department complaining of abdominal pain.  She reports 5 days of epigastric pain that radiates to both signs. Pain is severe and constant in nature. She reports multiple episodes of emesis, 10-20 times daily. No fevers but she feels hot at times. No diarrhea. She had dysuria a few weeks ago, none now. She is currently breast-feeding and has 2-month-old she denies any medical problems, prior similar symptoms. No history of abdominal surgery. She takes no medications.  Past Medical History:  Diagnosis Date  . Headache    occas. migraines    Patient Active Problem List   Diagnosis Date Noted  . NSVD (normal spontaneous vaginal delivery) 08/28/2015  . Active labor 08/27/2015  . Nausea with vomiting 07/03/2015    Past Surgical History:  Procedure Laterality Date  . MYRINGOTOMY      OB History    Gravida Para Term Preterm AB Living   SAB TAB Ectopic Multiple Live Births         0 2       Home Medications    Prior to Admission medications   Medication Sig Start Date End Date Taking? Authorizing Provider  famotidine (PEPCID) 20 MG tablet Take 1 tablet (20 mg total) by mouth 2 (two) times daily. 07/19/16   Tilden Fossa, MD  ondansetron (ZOFRAN) 4 MG tablet Take 1 tablet (4 mg total) by mouth every 8 (eight) hours as needed for nausea or vomiting. 07/19/16   Tilden Fossa, MD    Family History Family History  Problem Relation Age of Onset  . Heart disease Maternal Grandmother   .  Hyperlipidemia Maternal Grandmother   . Cancer Maternal Grandfather     Social History Social History  Substance Use Topics  . Smoking status: Never Smoker  . Smokeless tobacco: Never Used  . Alcohol use No     Allergies   Penicillins   Review of Systems Review of Systems  Gastrointestinal: Positive for abdominal pain and vomiting.  All other systems reviewed and are negative.    Physical Exam Updated Vital Signs BP 122/62 (BP Location: Left Arm)   Pulse 71   Temp 98.5 F (36.9 C) (Oral)   Resp 18   Ht  (1.6 m)   Wt 130 lb (59 kg)   LMP 06/21/2016   SpO2 99%   BMI 23.03 kg/m   Physical Exam  Constitutional: She is oriented to person, place, and time. She appears well-developed and well-nourished.  HENT:  Head: Normocephalic and atraumatic.  Cardiovascular: Regular rhythm.   No murmur heard. tachycardic  Pulmonary/Chest: Effort normal and breath sounds normal. No respiratory distress.  Abdominal: Soft. There is no rebound and no guarding.  Moderate epigastric tenderness  Musculoskeletal: She exhibits no edema or tenderness.  Neurological: She is alert and oriented to person, place, and time.  Skin: Skin is warm and dry.  Psychiatric: She has a normal mood and  affect. Her behavior is normal.  Nursing note and vitals reviewed.    ED Treatments / Results  Labs (all labs ordered are listed, but only abnormal results are displayed) Labs Reviewed  COMPREHENSIVE METABOLIC PANEL - Abnormal; Notable for the following:       Result Value   Potassium 2.9 (*)    Glucose, Bld 102 (*)    All other components within normal limits  URINALYSIS, ROUTINE W REFLEX MICROSCOPIC - Abnormal; Notable for the following:    Color, Urine AMBER (*)    APPearance CLOUDY (*)    Ketones, ur 5 (*)    Protein, ur 30 (*)    Leukocytes, UA MODERATE (*)    Bacteria, UA MANY (*)    Squamous Epithelial / LPF TOO NUMEROUS TO COUNT (*)    All other components within normal limits    URINE CULTURE  CBC WITH DIFFERENTIAL/PLATELET  LIPASE, BLOOD  POC URINE PREG, ED    EKG  EKG Interpretation None       Radiology No results found.  Procedures Procedures (including critical care time)  Medications Ordered in ED Medications  sodium chloride 0.9 % bolus 1,000 mL (0 mLs Intravenous Stopped 07/19/16 1322)  famotidine (PEPCID) IVPB 20 mg premix (0 mg Intravenous Stopped 07/19/16 1239)  ondansetron (ZOFRAN) injection 4 mg (4 mg Intravenous Given 07/19/16 1200)  potassium chloride SA (K-DUR,KLOR-CON) CR tablet 40 mEq (40 mEq Oral Given 07/19/16 1304)  sucralfate (CARAFATE) tablet 1 g (1 g Oral Given 07/19/16 1354)     Initial Impression / Assessment and Plan / ED Course  I have reviewed the triage vital signs and the nursing notes.  Pertinent labs & imaging results that were available during my care of the patient were reviewed by me and considered in my medical decision making (see chart for details).     Patient here for upper abdominal pain and vomiting. On repeat assessment after IV fluids and antiemetics she is feeling improved. Presentation is consistent with acute gastritis. No evidence of pancreatitis, cholecystitis. Conunseled on home care with oral fluid hydration, Pepcid. She can take Zofran as needed but will need to discard her breast milk. Discussed outpatient follow-up and return impressions.  Final Clinical Impressions(s) / ED Diagnoses   Final diagnoses:  Acute gastritis without hemorrhage, unspecified gastritis type    New Prescriptions Discharge Medication List as of 07/19/2016  2:30 PM    START taking these medications   Details  famotidine (PEPCID) 20 MG tablet Take 1 tablet (20 mg total) by mouth 2 (two) times daily., Starting Tue 07/19/2016, Print    ondansetron (ZOFRAN) 4 MG tablet Take 1 tablet (4 mg total) by mouth every 8 (eight) hours as needed for nausea or vomiting., Starting Tue 07/19/2016, Print         Tilden Fossa, MD 07/19/16  1544

## 2016-07-19 NOTE — ED Triage Notes (Signed)
Pt reports she has had upper midline abd pain and emesis intermittently since Friday. No diarrhea.

## 2016-07-19 NOTE — ED Notes (Signed)
Pt ambulatory and independent at discharge.  Verbalized understanding of discharge instructions 

## 2016-07-20 LAB — URINE CULTURE: Culture: <10000 — AB

## 2017-06-11 ENCOUNTER — Emergency Department (HOSPITAL_COMMUNITY)
Admission: EM | Admit: 2017-06-11 | Discharge: 2017-06-11 | Disposition: A | Discharge status: Home / Self Care | Payer: Medicaid Other | Attending: Emergency Medicine | Admitting: Emergency Medicine

## 2017-06-11 ENCOUNTER — Encounter (HOSPITAL_COMMUNITY): Payer: Self-pay | Admitting: Emergency Medicine

## 2017-06-11 DIAGNOSIS — K2930 Chronic superficial gastritis without bleeding: Secondary | ICD-10-CM | POA: Diagnosis not present

## 2017-06-11 DIAGNOSIS — R1013 Epigastric pain: Secondary | ICD-10-CM | POA: Diagnosis not present

## 2017-06-11 LAB — CBC
HEMATOCRIT: 44.8 % (ref 36.0–46.0)
HEMOGLOBIN: 15.7 g/dL — AB (ref 12.0–15.0)
MCH: 31 pg (ref 26.0–34.0)
MCHC: 35 g/dL (ref 30.0–36.0)
MCV: 88.4 fL (ref 78.0–100.0)
Platelets: 176 10*3/uL (ref 150–400)
RBC: 5.07 MIL/uL (ref 3.87–5.11)
RDW: 12.4 % (ref 11.5–15.5)
WBC: 9.3 10*3/uL (ref 4.0–10.5)

## 2017-06-11 LAB — COMPREHENSIVE METABOLIC PANEL
ALBUMIN: 4.7 g/dL (ref 3.5–5.0)
ALT: 15 U/L (ref 14–54)
ANION GAP: 15 (ref 5–15)
AST: 25 U/L (ref 15–41)
Alkaline Phosphatase: 67 U/L (ref 38–126)
BUN: 12 mg/dL (ref 6–20)
CO2: 23 mmol/L (ref 22–32)
Calcium: 9.9 mg/dL (ref 8.9–10.3)
Chloride: 99 mmol/L — ABNORMAL LOW (ref 101–111)
Creatinine, Ser: 0.76 mg/dL (ref 0.44–1.00)
GFR calc Af Amer: >60 mL/min (ref >60)
GFR calc non Af Amer: >60 mL/min (ref >60)
GLUCOSE: 120 mg/dL — AB (ref 65–99)
POTASSIUM: 3.4 mmol/L — AB (ref 3.5–5.1)
SODIUM: 137 mmol/L (ref 135–145)
Total Bilirubin: 1 mg/dL (ref 0.3–1.2)
Total Protein: 8.1 g/dL (ref 6.5–8.1)

## 2017-06-11 LAB — URINALYSIS, ROUTINE W REFLEX MICROSCOPIC
BACTERIA UA: NONE SEEN
Bilirubin Urine: NEGATIVE
Glucose, UA: NEGATIVE mg/dL
HGB URINE DIPSTICK: NEGATIVE
Ketones, ur: 20 mg/dL — AB
Leukocytes, UA: NEGATIVE
Nitrite: NEGATIVE
PROTEIN: 100 mg/dL — AB
Specific Gravity, Urine: 1.029 (ref 1.005–1.030)
pH: 7 (ref 5.0–8.0)

## 2017-06-11 LAB — I-STAT BETA HCG BLOOD, ED (MC, WL, AP ONLY): I-stat hCG, quantitative: <5 m[IU]/mL (ref <5)

## 2017-06-11 LAB — LIPASE, BLOOD: Lipase: 113 U/L — ABNORMAL HIGH (ref 11–51)

## 2017-06-11 LAB — POC OCCULT BLOOD, ED: Fecal Occult Bld: NEGATIVE

## 2017-06-11 MED ORDER — OMEPRAZOLE 20 MG PO CPDR: 20.0000 mg | DELAYED_RELEASE_CAPSULE | Freq: Every day | ORAL | 0 refills | Status: DC

## 2017-06-11 MED ORDER — PANTOPRAZOLE SODIUM 40 MG IV SOLR
40.0000 mg | Freq: Once | INTRAVENOUS | Status: AC
Start: 2017-06-11 — End: 2017-06-11
  Administered 2017-06-11: 40 mg via INTRAVENOUS
  Filled 2017-06-11: qty 40

## 2017-06-11 MED ORDER — PROCHLORPERAZINE EDISYLATE 5 MG/ML IJ SOLN
10.0000 mg | Freq: Once | INTRAMUSCULAR | Status: AC
  Administered 2017-06-11: 10 mg via INTRAVENOUS
  Filled 2017-06-11: qty 2

## 2017-06-11 MED ORDER — MORPHINE SULFATE (PF) 4 MG/ML IV SOLN
4.0000 mg | Freq: Once | INTRAVENOUS | Status: AC
  Administered 2017-06-11: 4 mg via INTRAVENOUS
  Filled 2017-06-11: qty 1

## 2017-06-11 MED ORDER — ONDANSETRON HCL 4 MG/2ML IJ SOLN
4.0000 mg | Freq: Once | INTRAMUSCULAR | Status: AC
  Administered 2017-06-11: 4 mg via INTRAVENOUS
  Filled 2017-06-11: qty 2

## 2017-06-11 MED ORDER — FAMOTIDINE 20 MG IN NS 100 ML IVPB
20.0000 mg | Freq: Once | INTRAVENOUS | Status: AC
  Administered 2017-06-11: 20 mg via INTRAVENOUS
  Filled 2017-06-11: qty 100

## 2017-06-11 MED ORDER — FAMOTIDINE 20 MG PO TABS: 20.0000 mg | ORAL_TABLET | Freq: Two times a day (BID) | ORAL | 0 refills | Status: DC

## 2017-06-11 MED ORDER — SUCRALFATE 1 GM/10ML PO SUSP: 1.0000 g | Freq: Three times a day (TID) | ORAL | 0 refills | Status: DC

## 2017-06-11 MED ORDER — DIPHENHYDRAMINE HCL 50 MG/ML IJ SOLN
25.0000 mg | Freq: Once | INTRAMUSCULAR | Status: AC
  Administered 2017-06-11: 25 mg via INTRAVENOUS
  Filled 2017-06-11: qty 1

## 2017-06-11 MED ORDER — PROMETHAZINE HCL 25 MG PO TABS: 25.0000 mg | ORAL_TABLET | Freq: Four times a day (QID) | ORAL | 0 refills | Status: DC | PRN

## 2017-06-11 MED ORDER — SODIUM CHLORIDE 0.9 % IV BOLUS (SEPSIS)
1000.0000 mL | Freq: Once | INTRAVENOUS | Status: AC
  Administered 2017-06-11: 1000 mL via INTRAVENOUS

## 2017-06-11 NOTE — ED Notes (Signed)
Pt able to tolerate fluids denies any nausea or pain

## 2017-06-11 NOTE — ED Notes (Signed)
Pt experiencing severe burning in chest and continuous nausea. PA Highmorelaudia notified

## 2017-06-11 NOTE — ED Notes (Signed)
Pt stable, ambulatory, states understanding of discharge instructions 

## 2017-06-11 NOTE — ED Triage Notes (Signed)
Pt to ER for LUQ abd pain with vomiting since Thursday, states emesis is coffee ground. States recently has been taking a lot of excedrin for headaches 3x a week from previous head injury. Pt a/o x4.

## 2017-06-11 NOTE — Discharge Instructions (Signed)
Symptoms likely from gastritis or ulcers. Both are treated similarly. You need endoscopy to diagnose ulcers.   Avoid alcohol, fatty/greasy/spicy foods, caffeine, cigarettes. These cause more acid production and worsen gastritis.   Take carafate before every meal.  Omeprazole and zantac help with acid production. Phenergan for nausea, this pill can be used orally or rectally for more severe nausea and vomiting. Follow up with gastroenterology in  1 week for further evaluation. Return for chest pain, shortness of breath, light-headedness, bloody or black vomit or stools.

## 2017-06-11 NOTE — ED Provider Notes (Signed)
[facility] EMERGENCY DEPARTMENT Provider Note   CSN: [id number] Arrival date & time: 06/11/17  1027     History   Chief Complaint Chief Complaint  Patient presents with  . Abdominal Pain  . Hematemesis    HPI Julie Hughes is a 27 y.o. female with history of headaches, gastritis is here for evaluation of burning, sharp pain tobelly button and epigastric abdomen associated with nausea and vomiting for 3 days.States she had 3-4 shots of alcohol the night before symptoms started.For she began having nausea and vomiting a mix of green/yellow emesis that then turned coffee ground and bloody. She has vomited 5 times today. States her abdominal pain is worse when she is actively vomiting. In the prior 2-3 weeks she has had looser bowel movements that appear to be darker. Feels light-headed when she moves too quickly, better when she stays still. Has not been able to drink or eat anything in 2-3 days. Has been taking sips of green powerade. She denies fevers, chills, chest pain, shortness of breath, tearing back pain, dysuria, constipation. No previous abdominal surgeries. Was told several months ago that she had gastritis. States she has been trying to cut back on alcohol. Does not take ibuprofen. Takes Excedrin frequently for headaches.  HPI  Past Medical History:  Diagnosis Date  . Headache    occas. migraines    Patient Active Problem List   Diagnosis Date Noted  . NSVD (normal spontaneous vaginal delivery) 08/28/2015  . Active labor 08/27/2015  . Nausea with vomiting 07/03/2015    Past Surgical History:  Procedure Laterality Date  . MYRINGOTOMY      OB History    Gravida Para Term Preterm AB Living   2 2 2     2    SAB TAB Ectopic Multiple Live Births         0 2       Home Medications    Prior to Admission medications   Medication Sig Start Date End Date Taking? Authorizing Provider  famotidine (PEPCID) 20 MG tablet Take 1 tablet (20 mg total) by  mouth 2 (two) times daily. 06/11/17   Liberty Handy, PA-C  omeprazole (PRILOSEC) 20 MG capsule Take 1 capsule (20 mg total) by mouth daily. 06/11/17   Liberty Handy, PA-C  ondansetron (ZOFRAN) 4 MG tablet Take 1 tablet (4 mg total) by mouth every 8 (eight) hours as needed for nausea or vomiting. 07/19/16   Tilden Fossa, MD  promethazine (PHENERGAN) 25 MG tablet Take 1 tablet (25 mg total) by mouth every 6 (six) hours as needed for nausea or vomiting. 06/11/17   Liberty Handy, PA-C  sucralfate (CARAFATE) 1 GM/10ML suspension Take 10 mLs (1 g total) by mouth 4 (four) times daily -  with meals and at bedtime. 06/11/17   Liberty Handy, PA-C    Family History Family History  Problem Relation Age of Onset  . Heart disease Maternal Grandmother   . Hyperlipidemia Maternal Grandmother   . Cancer Maternal Grandfather     Social History Social History   Tobacco Use  . Smoking status: Never Smoker  . Smokeless tobacco: Never Used  Substance Use Topics  . Alcohol use: No  . Drug use: No     Allergies   Penicillins   Review of Systems Review of Systems  Gastrointestinal: Positive for abdominal pain, blood in stool, diarrhea, nausea and vomiting.  Neurological: Positive for light-headedness.  All other systems reviewed and are negative.  Physical Exam Updated Vital Signs BP (!) 119/58   Pulse (!) 58   Temp 98.2 F (36.8 C) (Oral)   Resp 16   LMP 05/09/2017   SpO2 98%   Physical Exam  Constitutional: She is oriented to person, place, and time. She appears well-developed and well-nourished. No distress.  Non toxic  HENT:  Head: Normocephalic and atraumatic.  Nose: Nose normal.  Mouth/Throat: No oropharyngeal exudate.  Moist mucous membranes   Eyes: Conjunctivae and EOM are normal. Pupils are equal, round, and reactive to light.  Neck: Normal range of motion.  Cardiovascular: Regular rhythm and intact distal pulses. Tachycardia present.  No murmur  heard. 2+ DP and radial pulses bilaterally. No LE edema.   Pulmonary/Chest: Effort normal and breath sounds normal. No respiratory distress. She has no wheezes. She has no rales.  Abdominal: Soft. Bowel sounds are normal. There is tenderness in the epigastric area and periumbilical area.  No G/R/R. No suprapubic or CVA tenderness. Able to sit up in bed without obvious discomfort.   Musculoskeletal: Normal range of motion. She exhibits no deformity.  Neurological: She is alert and oriented to person, place, and time.  Skin: Skin is warm and dry. Capillary refill takes less than 2 seconds.  Psychiatric: She has a normal mood and affect. Her behavior is normal. Judgment and thought content normal.  Nursing note and vitals reviewed.    ED Treatments / Results  Labs (all labs ordered are listed, but only abnormal results are displayed) Labs Reviewed  LIPASE, BLOOD - Abnormal; Notable for the following components:      Result Value   Lipase 113 (*)    All other components within normal limits  COMPREHENSIVE METABOLIC PANEL - Abnormal; Notable for the following components:   Potassium 3.4 (*)    Chloride 99 (*)    Glucose, Bld 120 (*)    All other components within normal limits  CBC - Abnormal; Notable for the following components:   Hemoglobin 15.7 (*)    All other components within normal limits  URINALYSIS, ROUTINE W REFLEX MICROSCOPIC - Abnormal; Notable for the following components:   Color, Urine AMBER (*)    APPearance HAZY (*)    Ketones, ur 20 (*)    Protein, ur 100 (*)    Squamous Epithelial / LPF 6-30 (*)    All other components within normal limits  I-STAT BETA HCG BLOOD, ED ([facility], [facility], [facility] ONLY)  POC OCCULT BLOOD, ED    EKG  EKG Interpretation  Date/Time:  Sunday June 11 2017 12:33:52 EST Ventricular Rate:  69 PR Interval:    QRS Duration: 90 QT Interval:  390 QTC Calculation: 418 R Axis:   75 Text Interpretation:  Sinus rhythm Nonspecific ST abnormality No  previous tracing Confirmed by Cathren Laine ([id number]) on 06/11/2017 12:41:59 PM       Radiology No results found.  Procedures Procedures (including critical care time)  Medications Ordered in ED Medications  sodium chloride 0.9 % bolus 1,000 mL (0 mLs Intravenous Stopped 06/11/17 1428)  pantoprazole (PROTONIX) injection 40 mg (40 mg Intravenous Given 06/11/17 1247)  famotidine (PEPCID) IVPB 20 mg in NS 100 mL IVPB (20 mg Intravenous Given 06/11/17 1248)  ondansetron (ZOFRAN) injection 4 mg (4 mg Intravenous Given 06/11/17 1245)  morphine 4 MG/ML injection 4 mg (4 mg Intravenous Given 06/11/17 1245)  prochlorperazine (COMPAZINE) injection 10 mg (10 mg Intravenous Given 06/11/17 1325)  diphenhydrAMINE (BENADRYL) injection 25 mg (25 mg Intravenous Given 06/11/17 1326)  Initial Impression / Assessment and Plan / ED Course  I have reviewed the triage vital signs and the nursing notes.  Pertinent labs & imaging results that were available during my care of the patient were reviewed by me and considered in my medical decision making (see chart for details).  Clinical Course as of Jun 11 1437  Wynelle LinkSun Jun 11, 2017  1206 Lipase: (!) 113 [CG]  1251 Re-evaluated pt. She just received morphine. She is in NAD, joking with family member at bedside. Discussed work up and likely gastritis vs PUD. Will try PO challenge. No emesis while in Ed.   [CG]  1430 Reevaluated patient. She reports no abdominal pain, tolerating ginger ale.  [CG]    Clinical Course User Index [CG] Liberty HandyGibbons, Dean Wonder J, PA-C   Pt with epigastric tenderness. She reports alcohol intake night before symptoms started. Endorses ground coffee emesis and darker stools. Concern for gastritis vs PUD vs pancreatitis vs cholecystitis. Initially tachycardic and tachypnic.  Will move into room, provide IVF, protonix, pepcid, analgesia, antiemetics and reassess.   1221: LFT and WBC normal. Hgb normal. Negative pregnancy. Lipase elevated 113.  Given  focal epigastric tenderness considering pancreatitis. Pt discussed and evaluated by Dr Denton LankSteinl who recommends conservative management and symptoms control for likely gastritis.  Final Clinical Impressions(s) / ED Diagnoses   Pt with resolution of pain and nausea. Tolerating PO. No emesis or diarrhea in ED. Will dc with omeprazole, pepcid, carafate and GI f/u. Encouraged ETOH and NSAID avoidance. VS normalized after symptoms control.  Final diagnoses:  Chronic superficial gastritis without bleeding    ED Discharge Orders        Ordered    sucralfate (CARAFATE) 1 GM/10ML suspension  3 times daily with meals & bedtime     06/11/17 1434    omeprazole (PRILOSEC) 20 MG capsule  Daily     06/11/17 1434    famotidine (PEPCID) 20 MG tablet  2 times daily     06/11/17 1434    promethazine (PHENERGAN) 25 MG tablet  Every 6 hours PRN     06/11/17 1434       Liberty HandyGibbons, Johnston Maddocks J, [place]PA-C 06/11/17 1439    Cathren LaineSteinl, Kevin, MD 06/11/17 1451

## 2017-07-29 ENCOUNTER — Encounter (HOSPITAL_COMMUNITY): Payer: Self-pay

## 2017-07-29 ENCOUNTER — Other Ambulatory Visit: Payer: Self-pay

## 2017-07-29 ENCOUNTER — Inpatient Hospital Stay (HOSPITAL_COMMUNITY)
Admission: EM | Admit: 2017-07-29 | Discharge: 2017-08-01 | DRG: 379 | Disposition: A | Discharge status: Home / Self Care | Payer: Medicaid Other | Attending: Internal Medicine | Admitting: Internal Medicine

## 2017-07-29 DIAGNOSIS — R739 Hyperglycemia, unspecified: Secondary | ICD-10-CM | POA: Diagnosis not present

## 2017-07-29 DIAGNOSIS — K921 Melena: Secondary | ICD-10-CM | POA: Diagnosis present

## 2017-07-29 DIAGNOSIS — Z8249 Family history of ischemic heart disease and other diseases of the circulatory system: Secondary | ICD-10-CM

## 2017-07-29 DIAGNOSIS — Z8719 Personal history of other diseases of the digestive system: Secondary | ICD-10-CM

## 2017-07-29 DIAGNOSIS — K209 Esophagitis, unspecified without bleeding: Secondary | ICD-10-CM

## 2017-07-29 DIAGNOSIS — D72829 Elevated white blood cell count, unspecified: Secondary | ICD-10-CM | POA: Diagnosis present

## 2017-07-29 DIAGNOSIS — Z87820 Personal history of traumatic brain injury: Secondary | ICD-10-CM

## 2017-07-29 DIAGNOSIS — F1010 Alcohol abuse, uncomplicated: Secondary | ICD-10-CM | POA: Diagnosis present

## 2017-07-29 DIAGNOSIS — Z79899 Other long term (current) drug therapy: Secondary | ICD-10-CM

## 2017-07-29 DIAGNOSIS — E876 Hypokalemia: Secondary | ICD-10-CM | POA: Diagnosis present

## 2017-07-29 DIAGNOSIS — Z880 Allergy status to penicillin: Secondary | ICD-10-CM

## 2017-07-29 DIAGNOSIS — K920 Hematemesis: Secondary | ICD-10-CM | POA: Diagnosis not present

## 2017-07-29 DIAGNOSIS — R7989 Other specified abnormal findings of blood chemistry: Secondary | ICD-10-CM | POA: Diagnosis not present

## 2017-07-29 DIAGNOSIS — R1013 Epigastric pain: Secondary | ICD-10-CM | POA: Diagnosis not present

## 2017-07-29 DIAGNOSIS — R519 Headache, unspecified: Secondary | ICD-10-CM | POA: Diagnosis present

## 2017-07-29 DIAGNOSIS — N83202 Unspecified ovarian cyst, left side: Secondary | ICD-10-CM | POA: Diagnosis present

## 2017-07-29 DIAGNOSIS — D649 Anemia, unspecified: Secondary | ICD-10-CM | POA: Diagnosis present

## 2017-07-29 DIAGNOSIS — Z87898 Personal history of other specified conditions: Secondary | ICD-10-CM | POA: Diagnosis not present

## 2017-07-29 DIAGNOSIS — K922 Gastrointestinal hemorrhage, unspecified: Secondary | ICD-10-CM | POA: Diagnosis present

## 2017-07-29 DIAGNOSIS — K210 Gastro-esophageal reflux disease with esophagitis: Secondary | ICD-10-CM | POA: Diagnosis present

## 2017-07-29 DIAGNOSIS — Z791 Long term (current) use of non-steroidal anti-inflammatories (NSAID): Secondary | ICD-10-CM

## 2017-07-29 DIAGNOSIS — R51 Headache: Secondary | ICD-10-CM | POA: Diagnosis not present

## 2017-07-29 DIAGNOSIS — R531 Weakness: Secondary | ICD-10-CM | POA: Diagnosis not present

## 2017-07-29 DIAGNOSIS — R112 Nausea with vomiting, unspecified: Secondary | ICD-10-CM | POA: Diagnosis not present

## 2017-07-29 DIAGNOSIS — Z7141 Alcohol abuse counseling and surveillance of alcoholic: Secondary | ICD-10-CM

## 2017-07-29 LAB — COMPREHENSIVE METABOLIC PANEL
ALK PHOS: 61 U/L (ref 38–126)
ALT: 17 U/L (ref 14–54)
AST: 18 U/L (ref 15–41)
Albumin: 5 g/dL (ref 3.5–5.0)
Anion gap: 12 (ref 5–15)
BUN: 11 mg/dL (ref 6–20)
CALCIUM: 9.8 mg/dL (ref 8.9–10.3)
CO2: 23 mmol/L (ref 22–32)
CREATININE: 0.62 mg/dL (ref 0.44–1.00)
Chloride: 100 mmol/L — ABNORMAL LOW (ref 101–111)
GFR calc non Af Amer: >60 mL/min (ref >60)
Glucose, Bld: 133 mg/dL — ABNORMAL HIGH (ref 65–99)
Potassium: 3.5 mmol/L (ref 3.5–5.1)
SODIUM: 135 mmol/L (ref 135–145)
Total Bilirubin: 1.1 mg/dL (ref 0.3–1.2)
Total Protein: 8.1 g/dL (ref 6.5–8.1)

## 2017-07-29 LAB — CBC
HCT: 43.1 % (ref 36.0–46.0)
Hemoglobin: 15 g/dL (ref 12.0–15.0)
MCH: 30.6 pg (ref 26.0–34.0)
MCHC: 34.8 g/dL (ref 30.0–36.0)
MCV: 88 fL (ref 78.0–100.0)
PLATELETS: 238 10*3/uL (ref 150–400)
RBC: 4.9 MIL/uL (ref 3.87–5.11)
RDW: 12.7 % (ref 11.5–15.5)
WBC: 10.9 10*3/uL — ABNORMAL HIGH (ref 4.0–10.5)

## 2017-07-29 LAB — TSH: TSH: 0.188 u[IU]/mL — AB (ref 0.350–4.500)

## 2017-07-29 LAB — URINALYSIS, ROUTINE W REFLEX MICROSCOPIC
Bilirubin Urine: NEGATIVE
GLUCOSE, UA: NEGATIVE mg/dL
Hgb urine dipstick: NEGATIVE
Ketones, ur: 80 mg/dL — AB
Leukocytes, UA: NEGATIVE
Nitrite: NEGATIVE
PH: 9 — AB (ref 5.0–8.0)
Protein, ur: 100 mg/dL — AB
SPECIFIC GRAVITY, URINE: 1.028 (ref 1.005–1.030)

## 2017-07-29 LAB — CBC WITH DIFFERENTIAL/PLATELET
Basophils Absolute: 0 10*3/uL (ref 0.0–0.1)
Basophils Relative: 0 %
EOS PCT: 0 %
Eosinophils Absolute: 0 10*3/uL (ref 0.0–0.7)
HCT: 40.1 % (ref 36.0–46.0)
HEMOGLOBIN: 13.9 g/dL (ref 12.0–15.0)
LYMPHS ABS: 2 10*3/uL (ref 0.7–4.0)
LYMPHS PCT: 14 %
MCH: 30.3 pg (ref 26.0–34.0)
MCHC: 34.7 g/dL (ref 30.0–36.0)
MCV: 87.6 fL (ref 78.0–100.0)
MONOS PCT: 8 %
Monocytes Absolute: 1.1 10*3/uL — ABNORMAL HIGH (ref 0.1–1.0)
Neutro Abs: 11 10*3/uL — ABNORMAL HIGH (ref 1.7–7.7)
Neutrophils Relative %: 78 %
PLATELETS: 217 10*3/uL (ref 150–400)
RBC: 4.58 MIL/uL (ref 3.87–5.11)
RDW: 12.5 % (ref 11.5–15.5)
WBC: 14.1 10*3/uL — ABNORMAL HIGH (ref 4.0–10.5)

## 2017-07-29 LAB — FOLATE: Folate: 14 ng/mL (ref >5.9)

## 2017-07-29 LAB — RETICULOCYTES
RBC.: 4.58 MIL/uL (ref 3.87–5.11)
RETIC CT PCT: 1.3 % (ref 0.4–3.1)
Retic Count, Absolute: 59.5 10*3/uL (ref 19.0–186.0)

## 2017-07-29 LAB — VITAMIN B12: Vitamin B-12: 319 pg/mL (ref 180–914)

## 2017-07-29 LAB — IRON AND TIBC
IRON: 39 ug/dL (ref 28–170)
Saturation Ratios: 11 % (ref 10.4–31.8)
TIBC: 364 ug/dL (ref 250–450)
UIBC: 325 ug/dL

## 2017-07-29 LAB — I-STAT BETA HCG BLOOD, ED (MC, WL, AP ONLY): I-stat hCG, quantitative: <5 m[IU]/mL (ref <5)

## 2017-07-29 LAB — TYPE AND SCREEN
ABO/RH(D): A NEG
Antibody Screen: NEGATIVE

## 2017-07-29 LAB — FERRITIN: FERRITIN: 35 ng/mL (ref 11–307)

## 2017-07-29 LAB — ABO/RH: ABO/RH(D): A NEG

## 2017-07-29 LAB — PROTIME-INR
INR: 1.08
Prothrombin Time: 13.9 seconds (ref 11.4–15.2)

## 2017-07-29 LAB — LIPASE, BLOOD: Lipase: 25 U/L (ref 11–51)

## 2017-07-29 MED ORDER — METOCLOPRAMIDE HCL 5 MG/ML IJ SOLN
10.0000 mg | Freq: Once | INTRAMUSCULAR | Status: AC
  Administered 2017-07-29: 10 mg via INTRAVENOUS
  Filled 2017-07-29: qty 2

## 2017-07-29 MED ORDER — LORAZEPAM 2 MG/ML IJ SOLN
1.0000 mg | Freq: Four times a day (QID) | INTRAMUSCULAR | Status: DC | PRN
  Filled 2017-07-29: qty 1

## 2017-07-29 MED ORDER — SODIUM CHLORIDE 0.9 % IV SOLN
80.0000 mg | Freq: Once | INTRAVENOUS | Status: AC
  Administered 2017-07-29: 17:00:00 80 mg via INTRAVENOUS
  Filled 2017-07-29: qty 80

## 2017-07-29 MED ORDER — ACETAMINOPHEN 325 MG PO TABS: 650.0000 mg | ORAL_TABLET | Freq: Four times a day (QID) | ORAL | Status: DC | PRN

## 2017-07-29 MED ORDER — MORPHINE SULFATE (PF) 4 MG/ML IV SOLN
4.0000 mg | Freq: Once | INTRAVENOUS | Status: AC
  Administered 2017-07-29: 4 mg via INTRAVENOUS
  Filled 2017-07-29: qty 1

## 2017-07-29 MED ORDER — ACETAMINOPHEN 650 MG RE SUPP: 650.0000 mg | Freq: Four times a day (QID) | RECTAL | Status: DC | PRN

## 2017-07-29 MED ORDER — ONDANSETRON HCL 4 MG PO TABS: 4.0000 mg | ORAL_TABLET | Freq: Four times a day (QID) | ORAL | Status: DC | PRN

## 2017-07-29 MED ORDER — FOLIC ACID 1 MG PO TABS
1.0000 mg | ORAL_TABLET | Freq: Every day | ORAL | Status: DC
  Administered 2017-07-29 – 2017-08-01 (×3): 1 mg via ORAL
  Filled 2017-07-29 (×3): qty 1

## 2017-07-29 MED ORDER — ADULT MULTIVITAMIN W/MINERALS CH
1.0000 | ORAL_TABLET | Freq: Every day | ORAL | Status: DC
  Administered 2017-07-29 – 2017-08-01 (×3): 1 via ORAL
  Filled 2017-07-29 (×3): qty 1

## 2017-07-29 MED ORDER — ONDANSETRON HCL 4 MG/2ML IJ SOLN
4.0000 mg | Freq: Four times a day (QID) | INTRAMUSCULAR | Status: DC | PRN
  Administered 2017-07-30: 4 mg via INTRAVENOUS
  Filled 2017-07-29 (×3): qty 2

## 2017-07-29 MED ORDER — MORPHINE SULFATE (PF) 4 MG/ML IV SOLN: 1.0000 mg | INTRAVENOUS | Status: DC | PRN

## 2017-07-29 MED ORDER — ONDANSETRON HCL 4 MG/2ML IJ SOLN: 4.0000 mg | Freq: Four times a day (QID) | INTRAMUSCULAR | Status: DC | PRN

## 2017-07-29 MED ORDER — DIPHENHYDRAMINE HCL 50 MG/ML IJ SOLN
25.0000 mg | Freq: Once | INTRAMUSCULAR | Status: AC
  Administered 2017-07-29: 25 mg via INTRAVENOUS
  Filled 2017-07-29: qty 1

## 2017-07-29 MED ORDER — SODIUM CHLORIDE 0.9 % IV SOLN
INTRAVENOUS | Status: DC
  Administered 2017-07-29 – 2017-07-31 (×4): via INTRAVENOUS
  Administered 2017-08-01: 75 mL/h via INTRAVENOUS

## 2017-07-29 MED ORDER — PANTOPRAZOLE SODIUM 40 MG IV SOLR: 40.0000 mg | Freq: Two times a day (BID) | INTRAVENOUS | Status: DC

## 2017-07-29 MED ORDER — POTASSIUM CHLORIDE 10 MEQ/100ML IV SOLN
10.0000 meq | INTRAVENOUS | Status: AC
  Administered 2017-07-29 (×3): 10 meq via INTRAVENOUS
  Filled 2017-07-29 (×3): qty 100

## 2017-07-29 MED ORDER — ONDANSETRON 4 MG PO TBDP
8.0000 mg | ORAL_TABLET | Freq: Once | ORAL | Status: AC | PRN
  Administered 2017-07-29: 8 mg via ORAL
  Filled 2017-07-29: qty 2

## 2017-07-29 MED ORDER — LORAZEPAM 1 MG PO TABS: 1.0000 mg | ORAL_TABLET | Freq: Four times a day (QID) | ORAL | Status: DC | PRN

## 2017-07-29 MED ORDER — SODIUM CHLORIDE 0.9 % IV SOLN
8.0000 mg/h | INTRAVENOUS | Status: DC
  Administered 2017-07-29 – 2017-07-31 (×3): 8 mg/h via INTRAVENOUS
  Filled 2017-07-29 (×6): qty 80

## 2017-07-29 MED ORDER — PROMETHAZINE HCL 25 MG/ML IJ SOLN
12.5000 mg | Freq: Four times a day (QID) | INTRAMUSCULAR | Status: DC | PRN
  Administered 2017-07-30: 25 mg via INTRAVENOUS
  Administered 2017-07-31: 12.5 mg via INTRAVENOUS
  Administered 2017-07-31: 25 mg via INTRAVENOUS
  Administered 2017-08-01: 12.5 mg via INTRAVENOUS
  Filled 2017-07-29 (×5): qty 1

## 2017-07-29 MED ORDER — VITAMIN B-1 100 MG PO TABS
100.0000 mg | ORAL_TABLET | Freq: Every day | ORAL | Status: DC
  Administered 2017-07-29 – 2017-08-01 (×3): 100 mg via ORAL
  Filled 2017-07-29 (×3): qty 1

## 2017-07-29 MED ORDER — THIAMINE HCL 100 MG/ML IJ SOLN: 100.0000 mg | Freq: Every day | INTRAMUSCULAR | Status: DC

## 2017-07-29 NOTE — ED Notes (Signed)
Pt is not able to give a urine sample at this time.
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
## 2017-07-29 NOTE — ED Triage Notes (Signed)
Pt presents with 3 day h/o generalized abdominal pain and vomiting.  Pt reports noting blood in vomit, was seen here for same x 1 month ago, was discharge with gastritis and referred for endoscope which she has not gotten.

## 2017-07-29 NOTE — ED Provider Notes (Addendum)
[facility] EMERGENCY DEPARTMENT Provider Note   CSN: [id number] Arrival date & time: 07/29/17  1222     History   Chief Complaint Chief Complaint  Patient presents with  . Abdominal Pain    HPI Julie Hughes is a 27 y.o. female.  HPI   27 year old female with past medical history of recurrent nausea and vomiting and gastritis here with abdominal pain.  The patient states that over the last several days, she is had progressive worsening aching, gnawing, epigastric pain.  Pain is worse after eating.  She said associated melena and dark, tarry stools.  This is been ongoing for several months.  She is been seen twice in the ED for similar symptoms, and given antacids which provided temporary relief.  She did not follow-up with GI.  She states that earlier last night, she began vomiting bright red blood.  She is had persistent vomiting since then, which is intermittently bloody.  She has associated general fatigue.  No lightheadedness.  No syncope.  No alleviating factors.  Pain is worse with eating and palpation.  Past Medical History:  Diagnosis Date  . Headache    occas. migraines    Patient Active Problem List   Diagnosis Date Noted  . NSVD (normal spontaneous vaginal delivery) 08/28/2015  . Active labor 08/27/2015  . Nausea with vomiting 07/03/2015    Past Surgical History:  Procedure Laterality Date  . MYRINGOTOMY       OB History    Gravida  2   Para  2   Term  2   Preterm      AB      Living  2     SAB      TAB      Ectopic      Multiple  0   Live Births  2            Home Medications    Prior to Admission medications   Medication Sig Start Date End Date Taking? Authorizing Provider  famotidine (PEPCID) 20 MG tablet Take 1 tablet (20 mg total) by mouth 2 (two) times daily. 06/11/17   Liberty Handy, PA-C  omeprazole (PRILOSEC) 20 MG capsule Take 1 capsule (20 mg total) by mouth daily. 06/11/17   Liberty Handy, PA-C    ondansetron (ZOFRAN) 4 MG tablet Take 1 tablet (4 mg total) by mouth every 8 (eight) hours as needed for nausea or vomiting. 07/19/16   Tilden Fossa, MD  promethazine (PHENERGAN) 25 MG tablet Take 1 tablet (25 mg total) by mouth every 6 (six) hours as needed for nausea or vomiting. 06/11/17   Liberty Handy, PA-C  sucralfate (CARAFATE) 1 GM/10ML suspension Take 10 mLs (1 g total) by mouth 4 (four) times daily -  with meals and at bedtime. 06/11/17   Liberty Handy, PA-C    Family History Family History  Problem Relation Age of Onset  . Heart disease Maternal Grandmother   . Hyperlipidemia Maternal Grandmother   . Cancer Maternal Grandfather     Social History Social History   Tobacco Use  . Smoking status: Never Smoker  . Smokeless tobacco: Never Used  Substance Use Topics  . Alcohol use: No  . Drug use: No     Allergies   Penicillins   Review of Systems Review of Systems  Constitutional: Positive for fatigue. Negative for chills and fever.  HENT: Negative for congestion and rhinorrhea.   Eyes: Negative for visual disturbance.  Respiratory: Negative for cough, shortness of breath and wheezing.   Cardiovascular: Negative for chest pain and leg swelling.  Gastrointestinal: Positive for abdominal pain, nausea and vomiting. Negative for diarrhea.  Genitourinary: Negative for dysuria and flank pain.  Musculoskeletal: Negative for neck pain and neck stiffness.  Skin: Negative for rash and wound.  Allergic/Immunologic: Negative for immunocompromised state.  Neurological: Positive for weakness. Negative for syncope and headaches.  All other systems reviewed and are negative.    Physical Exam Updated Vital Signs BP 131/88   Pulse 67   Temp 98 F (36.7 C) (Oral)   Resp (!) 21   LMP 07/17/2017 (Approximate)   SpO2 100%   Physical Exam  Constitutional: She is oriented to person, place, and time. She appears well-developed and well-nourished. No distress.  HENT:   Head: Normocephalic and atraumatic.  Eyes: Conjunctivae are normal.  Neck: Neck supple.  Cardiovascular: Normal rate, regular rhythm and normal heart sounds. Exam reveals no friction rub.  No murmur heard. Pulmonary/Chest: Effort normal and breath sounds normal. No respiratory distress. She has no wheezes. She has no rales.  Abdominal: Normal appearance and bowel sounds are normal. She exhibits no distension. There is tenderness in the epigastric area. There is no rigidity, no rebound and no guarding.  Musculoskeletal: She exhibits no edema.  Neurological: She is alert and oriented to person, place, and time. She exhibits normal muscle tone.  Skin: Skin is warm. Capillary refill takes less than 2 seconds.  Psychiatric: She has a normal mood and affect.  Nursing note and vitals reviewed.    ED Treatments / Results  Labs (all labs ordered are listed, but only abnormal results are displayed) Labs Reviewed  COMPREHENSIVE METABOLIC PANEL - Abnormal; Notable for the following components:      Result Value   Chloride 100 (*)    Glucose, Bld 133 (*)    All other components within normal limits  CBC - Abnormal; Notable for the following components:   WBC 10.9 (*)    All other components within normal limits  LIPASE, BLOOD  URINALYSIS, ROUTINE W REFLEX MICROSCOPIC  PROTIME-INR  I-STAT BETA HCG BLOOD, ED ([facility], [facility], [facility] ONLY)  TYPE AND SCREEN    EKG None  Radiology No results found.  Procedures .Critical Care Performed by: Shaune Pollack, MD Authorized by: Shaune Pollack, MD   Critical care provider statement:    Critical care time (minutes):  35   Critical care time was exclusive of:  Separately billable procedures and treating other patients and teaching time   Critical care was necessary to treat or prevent imminent or life-threatening deterioration of the following conditions:  Circulatory failure and cardiac failure   Critical care was time spent personally by me on the  following activities:  Development of treatment plan with patient or surrogate, discussions with consultants, evaluation of patient's response to treatment, examination of patient, obtaining history from patient or surrogate, ordering and performing treatments and interventions, ordering and review of laboratory studies, ordering and review of radiographic studies, pulse oximetry, re-evaluation of patient's condition and review of old charts   I assumed direction of critical care for this patient from another provider in my specialty: no   Comments:     ActiVE GI bleed, started on IV PPI and drip, with urgent/emergent scope by GI   (including critical care time)  Medications Ordered in ED Medications  pantoprazole (PROTONIX) 80 mg in sodium chloride 0.9 % 250 mL (0.32 mg/mL) infusion (8 mg/hr Intravenous  New Bag/Given 07/29/17 1639)  pantoprazole (PROTONIX) injection 40 mg (has no administration in time range)  ondansetron (ZOFRAN-ODT) disintegrating tablet 8 mg (8 mg Oral Given 07/29/17 1253)  pantoprazole (PROTONIX) 80 mg in sodium chloride 0.9 % 100 mL IVPB (80 mg Intravenous New Bag/Given 07/29/17 1639)  metoCLOPramide (REGLAN) injection 10 mg (10 mg Intravenous Given 07/29/17 1638)  diphenhydrAMINE (BENADRYL) injection 25 mg (25 mg Intravenous Given 07/29/17 1638)  morphine 4 MG/ML injection 4 mg (4 mg Intravenous Given 07/29/17 1638)     Initial Impression / Assessment and Plan / ED Course  I have reviewed the triage vital signs and the nursing notes.  Pertinent labs & imaging results that were available during my care of the patient were reviewed by me and considered in my medical decision making (see chart for details).     27 year old female here with persistent epigastric pain and vomiting.  Clinical history and exam is consistent with likely peptic ulcer disease.  Patient is hemodynamic is stable but having active bloody emesis.  Will start on a Protonix bolus and drip, give Reglan for  nausea and gastric clearance, and consult GI.  Suspect she may need admission given that this is her third visit with ongoing hematemesis. No blood thinner use.  Discussed with Dr. Chales AbrahamsGupta of GI - continue PPI, NPO at [facility]. Admit to medicine.  Final Clinical Impressions(s) / ED Diagnoses   Final diagnoses:  UGIB (upper gastrointestinal bleed)    ED Discharge Orders    None       Shaune PollackIsaacs, Lubertha Leite, MD 07/29/17 1710    Shaune PollackIsaacs, Maisa Bedingfield, MD 08/07/17 1538

## 2017-07-29 NOTE — ED Notes (Signed)
Pt given ice chips per RN  

## 2017-07-30 ENCOUNTER — Encounter (HOSPITAL_COMMUNITY)
Admission: EM | Disposition: A | Discharge status: Home / Self Care | Payer: Self-pay | Source: Home / Self Care | Attending: Internal Medicine

## 2017-07-30 ENCOUNTER — Other Ambulatory Visit: Payer: Self-pay

## 2017-07-30 ENCOUNTER — Encounter (HOSPITAL_COMMUNITY): Payer: Self-pay | Admitting: *Deleted

## 2017-07-30 DIAGNOSIS — Z880 Allergy status to penicillin: Secondary | ICD-10-CM | POA: Diagnosis not present

## 2017-07-30 DIAGNOSIS — K209 Esophagitis, unspecified without bleeding: Secondary | ICD-10-CM

## 2017-07-30 DIAGNOSIS — K920 Hematemesis: Principal | ICD-10-CM

## 2017-07-30 DIAGNOSIS — F1010 Alcohol abuse, uncomplicated: Secondary | ICD-10-CM

## 2017-07-30 DIAGNOSIS — Z7141 Alcohol abuse counseling and surveillance of alcoholic: Secondary | ICD-10-CM | POA: Diagnosis not present

## 2017-07-30 DIAGNOSIS — K210 Gastro-esophageal reflux disease with esophagitis: Secondary | ICD-10-CM | POA: Diagnosis present

## 2017-07-30 DIAGNOSIS — Z8719 Personal history of other diseases of the digestive system: Secondary | ICD-10-CM

## 2017-07-30 DIAGNOSIS — R1013 Epigastric pain: Secondary | ICD-10-CM | POA: Diagnosis not present

## 2017-07-30 DIAGNOSIS — Z87820 Personal history of traumatic brain injury: Secondary | ICD-10-CM | POA: Diagnosis not present

## 2017-07-30 DIAGNOSIS — K922 Gastrointestinal hemorrhage, unspecified: Secondary | ICD-10-CM

## 2017-07-30 DIAGNOSIS — Z87898 Personal history of other specified conditions: Secondary | ICD-10-CM

## 2017-07-30 DIAGNOSIS — Z8249 Family history of ischemic heart disease and other diseases of the circulatory system: Secondary | ICD-10-CM | POA: Diagnosis not present

## 2017-07-30 DIAGNOSIS — D649 Anemia, unspecified: Secondary | ICD-10-CM | POA: Diagnosis present

## 2017-07-30 DIAGNOSIS — N83202 Unspecified ovarian cyst, left side: Secondary | ICD-10-CM | POA: Diagnosis present

## 2017-07-30 DIAGNOSIS — D72829 Elevated white blood cell count, unspecified: Secondary | ICD-10-CM | POA: Diagnosis present

## 2017-07-30 DIAGNOSIS — R739 Hyperglycemia, unspecified: Secondary | ICD-10-CM

## 2017-07-30 DIAGNOSIS — R51 Headache: Secondary | ICD-10-CM

## 2017-07-30 DIAGNOSIS — Z791 Long term (current) use of non-steroidal anti-inflammatories (NSAID): Secondary | ICD-10-CM | POA: Diagnosis not present

## 2017-07-30 DIAGNOSIS — K921 Melena: Secondary | ICD-10-CM | POA: Diagnosis present

## 2017-07-30 DIAGNOSIS — Z79899 Other long term (current) drug therapy: Secondary | ICD-10-CM | POA: Diagnosis not present

## 2017-07-30 DIAGNOSIS — E876 Hypokalemia: Secondary | ICD-10-CM | POA: Diagnosis present

## 2017-07-30 HISTORY — PX: ESOPHAGOGASTRODUODENOSCOPY: SHX5428

## 2017-07-30 LAB — CBC WITH DIFFERENTIAL/PLATELET
Basophils Absolute: 0 10*3/uL (ref 0.0–0.1)
Basophils Absolute: 0 10*3/uL (ref 0.0–0.1)
Basophils Relative: 0 %
Basophils Relative: 0 %
EOS ABS: 0.1 10*3/uL (ref 0.0–0.7)
EOS PCT: 1 %
EOS PCT: 0 %
Eosinophils Absolute: 0 10*3/uL (ref 0.0–0.7)
HCT: 37.9 % (ref 36.0–46.0)
HCT: 36.8 % (ref 36.0–46.0)
Hemoglobin: 12.9 g/dL (ref 12.0–15.0)
Hemoglobin: 12.5 g/dL (ref 12.0–15.0)
LYMPHS ABS: 2.5 10*3/uL (ref 0.7–4.0)
LYMPHS ABS: 1.8 10*3/uL (ref 0.7–4.0)
LYMPHS PCT: 26 %
LYMPHS PCT: 18 %
MCH: 30.5 pg (ref 26.0–34.0)
MCH: 30.1 pg (ref 26.0–34.0)
MCHC: 34 g/dL (ref 30.0–36.0)
MCHC: 34 g/dL (ref 30.0–36.0)
MCV: 89.6 fL (ref 78.0–100.0)
MCV: 88.7 fL (ref 78.0–100.0)
MONO ABS: 0.7 10*3/uL (ref 0.1–1.0)
MONO ABS: 0.5 10*3/uL (ref 0.1–1.0)
MONOS PCT: 5 %
Monocytes Relative: 7 %
NEUTROS ABS: 7.7 10*3/uL (ref 1.7–7.7)
Neutro Abs: 6.1 10*3/uL (ref 1.7–7.7)
Neutrophils Relative %: 66 %
Neutrophils Relative %: 77 %
PLATELETS: 200 10*3/uL (ref 150–400)
Platelets: 191 10*3/uL (ref 150–400)
RBC: 4.23 MIL/uL (ref 3.87–5.11)
RBC: 4.15 MIL/uL (ref 3.87–5.11)
RDW: 13 % (ref 11.5–15.5)
RDW: 12.5 % (ref 11.5–15.5)
WBC: 9.3 10*3/uL (ref 4.0–10.5)
WBC: 10 10*3/uL (ref 4.0–10.5)

## 2017-07-30 LAB — HEMOGLOBIN A1C
HEMOGLOBIN A1C: 4.8 % (ref 4.8–5.6)
Mean Plasma Glucose: 91.06 mg/dL

## 2017-07-30 LAB — BASIC METABOLIC PANEL
Anion gap: 9 (ref 5–15)
BUN: 9 mg/dL (ref 6–20)
CALCIUM: 9 mg/dL (ref 8.9–10.3)
CO2: 21 mmol/L — ABNORMAL LOW (ref 22–32)
CREATININE: 0.67 mg/dL (ref 0.44–1.00)
Chloride: 107 mmol/L (ref 101–111)
GFR calc Af Amer: >60 mL/min (ref >60)
GFR calc non Af Amer: >60 mL/min (ref >60)
GLUCOSE: 92 mg/dL (ref 65–99)
Potassium: 3.6 mmol/L (ref 3.5–5.1)
Sodium: 137 mmol/L (ref 135–145)

## 2017-07-30 LAB — HIV ANTIBODY (ROUTINE TESTING W REFLEX): HIV Screen 4th Generation wRfx: NONREACTIVE

## 2017-07-30 SURGERY — EGD (ESOPHAGOGASTRODUODENOSCOPY)
Anesthesia: Moderate Sedation

## 2017-07-30 MED ORDER — TRAMADOL HCL 50 MG PO TABS
50.0000 mg | ORAL_TABLET | Freq: Four times a day (QID) | ORAL | Status: DC | PRN
  Administered 2017-07-30: 50 mg via ORAL
  Filled 2017-07-30: qty 1

## 2017-07-30 MED ORDER — MORPHINE SULFATE (PF) 4 MG/ML IV SOLN: 1.0000 mg | INTRAVENOUS | Status: DC | PRN

## 2017-07-30 MED ORDER — PANTOPRAZOLE SODIUM 40 MG PO TBEC
40.0000 mg | DELAYED_RELEASE_TABLET | Freq: Every day | ORAL | Status: DC
  Administered 2017-07-30 – 2017-08-01 (×3): 40 mg via ORAL
  Filled 2017-07-30 (×3): qty 1

## 2017-07-30 MED ORDER — MORPHINE SULFATE (PF) 4 MG/ML IV SOLN
1.0000 mg | INTRAVENOUS | Status: DC | PRN
  Filled 2017-07-30: qty 1

## 2017-07-30 MED ORDER — FENTANYL CITRATE (PF) 100 MCG/2ML IJ SOLN
INTRAMUSCULAR | Status: DC | PRN
  Administered 2017-07-30 (×6): 25 ug via INTRAVENOUS

## 2017-07-30 MED ORDER — MIDAZOLAM HCL 5 MG/ML IJ SOLN
INTRAMUSCULAR | Status: AC
  Filled 2017-07-30: qty 3

## 2017-07-30 MED ORDER — MIDAZOLAM HCL 10 MG/2ML IJ SOLN
INTRAMUSCULAR | Status: DC | PRN
  Administered 2017-07-30: 1 mg via INTRAVENOUS
  Administered 2017-07-30 (×5): 2 mg via INTRAVENOUS

## 2017-07-30 MED ORDER — ONDANSETRON HCL 4 MG/2ML IJ SOLN
INTRAMUSCULAR | Status: AC
  Filled 2017-07-30: qty 2

## 2017-07-30 MED ORDER — FENTANYL CITRATE (PF) 100 MCG/2ML IJ SOLN
INTRAMUSCULAR | Status: AC
  Filled 2017-07-30: qty 4

## 2017-07-30 MED ORDER — DIPHENHYDRAMINE HCL 50 MG/ML IJ SOLN
INTRAMUSCULAR | Status: DC | PRN
  Administered 2017-07-30: 25 mg via INTRAVENOUS

## 2017-07-30 NOTE — Interval H&P Note (Signed)
History and Physical Interval Note:  07/30/2017 10:32 AM  Julie Hughes  has presented today for surgery, with the diagnosis of hematemesis  The various methods of treatment have been discussed with the patient and family. After consideration of risks, benefits and other options for treatment, the patient has consented to  Procedure(s): ESOPHAGOGASTRODUODENOSCOPY (EGD) (N/A) as a surgical intervention .  The patient's history has been reviewed, patient examined, no change in status, stable for surgery.  I have reviewed the patient's chart and labs.  Questions were answered to the patient's satisfaction.     Lynann Bolognaajesh Namine Beahm

## 2017-07-30 NOTE — ED Notes (Signed)
Pt states "I feel worse than yesterday" requested phenergan and states it worked for her yesterday. Admitting MD at the bedside.

## 2017-07-30 NOTE — ED Notes (Signed)
The patient is refusing CIWA/detox medications and states "I am not in detox my last drink was Wednesday." Pt boyfriend at the bedside is angry, states "Why are yall asking her these questions (CIWA questions) shouldn't yall have asked her these when she got here? My mother trains the nurses here and you should've asked these when she first got here." Pt boyfriend informed of CIWA protocol orders and that these questions would continue to be asked q6hours [facility]. Pt dry heaving, given zofran at 0850. Endo called at [phone] and requested boyfriend come to ED to watch her child at the bedside. Endo informed the pt s.o. Would be 20 minutes at 0820 and informed he Had arrived @ 0846. Endo presently states they are started a different case and it will be about 1hr - 1:15 mins. Pt informed. Boyfriend at bedside clearly disgruntled.
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
## 2017-07-30 NOTE — ED Notes (Signed)
Pt and toddler sleeping.

## 2017-07-30 NOTE — Op Note (Signed)
[facility] Patient Name: Julie Hughes Procedure Date : 07/30/2017 MRN: [id number] Attending MD: Lynann Bologna MD, MD Date of Birth: 04-08-1991 CSN: [id number] Age: 27 Admit Type: Inpatient Procedure:                Upper GI endoscopy Indications:              Recent gastrointestinal bleeding, Heartburn Providers:                Lynann Bologna MD, MD, Roselie Awkward, RN, Beryle Beams, Technician, Clearnce Sorrel, RN Referring MD:              Medicines:                See nursing notes. Complications:            No immediate complications. Estimated Blood Loss:     Estimated blood loss: none. Procedure:                Pre-Anesthesia Assessment:                           - Prior to the procedure, a History and Physical                            was performed, and patient medications and                            allergies were reviewed. The patient is competent.                            The risks and benefits of the procedure and the                            sedation options and risks were discussed with the                            patient. All questions were answered and informed                            consent was obtained. Patient identification and                            proposed procedure were verified by the physician.                            Mental Status Examination: alert and oriented.                            Airway Examination: normal oropharyngeal airway and                            neck mobility. Respiratory Examination: clear to  auscultation. CV Examination: normal. Prophylactic                            Antibiotics: The patient does not require                            prophylactic antibiotics. Prior Anticoagulants: The                            patient has taken no previous anticoagulant or                            antiplatelet agents. ASA Grade Assessment: I - A         normal, healthy patient. After reviewing the risks                            and benefits, the patient was deemed in                            satisfactory condition to undergo the procedure.                            The anesthesia plan was to use moderate sedation /                            analgesia (conscious sedation). Immediately prior                            to administration of medications, the patient was                            re-assessed for adequacy to receive sedatives. The                            heart rate, respiratory rate, oxygen saturations,                            blood pressure, adequacy of pulmonary ventilation,                            and response to care were monitored throughout the                            procedure. The physical status of the patient was                            re-assessed after the procedure.                           After obtaining informed consent, the endoscope was                            passed under direct vision. Throughout the  procedure, the patient's blood pressure, pulse, and                            oxygen saturations were monitored continuously. The                            [id number] ([id number]) scope was introduced through the                            mouth, and advanced to the second part of duodenum.                            The upper GI endoscopy was accomplished without                            difficulty. The patient tolerated the procedure                            well. Scope In: Scope Out: Findings:      LA Grade A (one or more mucosal breaks less than 5 mm, not extending       between tops of 2 mucosal folds) esophagitis with no bleeding was found       34 to 35 cm from the incisors. Transient small hiatal henia.      Minimal antral gastritis. Biopsies were taken with a cold forceps for       Helicobacter pylori testing using CLOtest.      The examined  duodenum was normal. Impression:               - LA Grade A reflux esophagitis.                           - Mild Gastritis. No active bleeding. Moderate Sedation:      yes. Recommendation:           - Patient has a contact number available for                            emergencies. The signs and symptoms of potential                            delayed complications were discussed with the                            patient. Return to normal activities tomorrow.                            Written discharge instructions were provided to the                            patient.                           - Resume previous diet.                           -  Await pathology results.                           - omeprazole 20 mg by mouth once a day.                           - Avoid nonsteroidals.                           - Minimize alcohol. Procedure Code(s):        --- Professional ---                           [phone], Esophagogastroduodenoscopy, flexible,                            transoral; with biopsy, single or multiple Diagnosis Code(s):        --- Professional ---                           K21.0, Gastro-esophageal reflux disease with                            esophagitis                           K92.2, Gastrointestinal hemorrhage, unspecified                           R12, Heartburn CPT copyright 2017 [employer] Medical Association. All rights reserved. The codes documented in this report are preliminary and upon coder review may  be revised to meet current compliance requirements. Lynann Bologna MD, MD 07/30/2017 11:07:49 AM This report has been signed electronically. Number of Addenda: 0

## 2017-07-30 NOTE — H&P (View-Only) (Signed)
Referring Provider: [facility]  Primary Care Physician:  Patient, No Pcp Per Primary Gastroenterologist:  none  Reason for Consultation:  hematemesis    ASSESSMENT AND PLAN:   27 year old with hematemesis, epigastric pain in a patient with history of alcohol use and nonsteroidal use. Hemoglobin 12.9. Plan to proceed with EGD. Continue PPIs. Needs counseling regarding alcohol abuse. Normal liver function tests.  HPI: Julie Hughes is a 27 y.o. 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 GI has been consulted for possible further evaluation. She didnot have drop in hemoglobin. Hematemesis was witnessed in the emergency room.  NO Previous EGD.  She has been to the emergency room several times. She had an appointment with GI but did not go for the appointment.    Past Medical History:  Diagnosis Date  . Headache    occas. migraines    Past Surgical History:  Procedure Laterality Date  . MYRINGOTOMY      Prior to Admission medications   Medication Sig Start Date End Date Taking? Authorizing Provider  aspirin-acetaminophen-caffeine (EXCEDRIN MIGRAINE) [phone] MG tablet Take 2 tablets by mouth every 6 (six) hours as needed for headache or migraine.   Yes [provider]  Multiple Vitamin (MULTIVITAMIN WITH MINERALS) TABS tablet Take 1 tablet by mouth daily.   Yes [provider]  famotidine (PEPCID) 20 MG tablet Take 1 tablet (20 mg total) by mouth 2 (two) times daily. Patient not taking: Reported on 07/29/2017 06/11/17   Liberty Handy, PA-C  omeprazole (PRILOSEC) 20 MG capsule Take 1 capsule (20 mg total) by mouth daily. Patient not taking: Reported on 07/29/2017 06/11/17   Liberty Handy, PA-C  ondansetron (ZOFRAN) 4 MG tablet Take 1 tablet (4 mg total) by mouth every 8 (eight) hours as needed for nausea or vomiting. Patient not taking: Reported on 07/29/2017  07/19/16   Tilden Fossa, MD  promethazine (PHENERGAN) 25 MG tablet Take 1 tablet (25 mg total) by mouth every 6 (six) hours as needed for nausea or vomiting. Patient not taking: Reported on 07/29/2017 06/11/17   Liberty Handy, PA-C  sucralfate (CARAFATE) 1 GM/10ML suspension Take 10 mLs (1 g total) by mouth 4 (four) times daily -  with meals and at bedtime. Patient not taking: Reported on 07/29/2017 06/11/17   Liberty Handy, PA-C    Current Facility-Administered Medications  Medication Dose Route Frequency Provider Last Rate Last Dose  . 0.9 %  sodium chloride infusion   Intravenous Continuous Russella Dar, NP 150 mL/hr at 07/30/17 0334    . acetaminophen (TYLENOL) tablet 650 mg  650 mg Oral Q6H PRN Russella Dar, NP       Or  . acetaminophen (TYLENOL) suppository 650 mg  650 mg Rectal Q6H PRN Russella Dar, NP      . folic acid (FOLVITE) tablet 1 mg  1 mg Oral Daily Russella Dar, NP   1 mg at 07/29/17 1852  . LORazepam (ATIVAN) tablet 1 mg  1 mg Oral Q6H PRN Russella Dar, NP       Or  . LORazepam (ATIVAN) injection 1 mg  1 mg Intravenous Q6H PRN Russella Dar, NP      . morphine 4 MG/ML injection 1-4 mg  1-4 mg Intravenous Q2H PRN Russella Dar, NP      . multivitamin with minerals tablet 1 tablet  1 tablet Oral Daily  Russella DarEllis, Allison L, NP   1 tablet at 07/29/17 1852  . ondansetron (ZOFRAN) injection 4 mg  4 mg Intravenous Q6H PRN Russella DarEllis, Allison L, NP   4 mg at 07/30/17 0850   Or  . promethazine (PHENERGAN) injection 12.5-25 mg  12.5-25 mg Intravenous Q6H PRN Russella DarEllis, Allison L, NP   25 mg at 07/30/17 0917  . pantoprazole (PROTONIX) 80 mg in sodium chloride 0.9 % 250 mL (0.32 mg/mL) infusion  8 mg/hr Intravenous Continuous Russella DarEllis, Allison L, NP 25 mL/hr at 07/30/17 0333 8 mg/hr at 07/30/17 0333  . [START ON 08/02/2017] pantoprazole (PROTONIX) injection 40 mg  40 mg Intravenous Q12H Junious SilkEllis, Allison L, NP      . thiamine (VITAMIN B-1) tablet 100 mg  100 mg Oral Daily  Russella DarEllis, Allison L, NP   100 mg at 07/29/17 1853   Or  . thiamine (B-1) injection 100 mg  100 mg Intravenous Daily Russella DarEllis, Allison L, NP       Current Outpatient Medications  Medication Sig Dispense Refill  . aspirin-acetaminophen-caffeine (EXCEDRIN MIGRAINE) 250-250-65 MG tablet Take 2 tablets by mouth every 6 (six) hours as needed for headache or migraine.    . Multiple Vitamin (MULTIVITAMIN WITH MINERALS) TABS tablet Take 1 tablet by mouth daily.    . famotidine (PEPCID) 20 MG tablet Take 1 tablet (20 mg total) by mouth 2 (two) times daily. (Patient not taking: Reported on 07/29/2017) 30 tablet 0  . omeprazole (PRILOSEC) 20 MG capsule Take 1 capsule (20 mg total) by mouth daily. (Patient not taking: Reported on 07/29/2017) 15 capsule 0  . ondansetron (ZOFRAN) 4 MG tablet Take 1 tablet (4 mg total) by mouth every 8 (eight) hours as needed for nausea or vomiting. (Patient not taking: Reported on 07/29/2017) 10 tablet 0  . promethazine (PHENERGAN) 25 MG tablet Take 1 tablet (25 mg total) by mouth every 6 (six) hours as needed for nausea or vomiting. (Patient not taking: Reported on 07/29/2017) 30 tablet 0  . sucralfate (CARAFATE) 1 GM/10ML suspension Take 10 mLs (1 g total) by mouth 4 (four) times daily -  with meals and at bedtime. (Patient not taking: Reported on 07/29/2017) 420 mL 0    Allergies as of 07/29/2017 - Review Complete 07/29/2017  Allergen Reaction Noted  . Penicillins Hives and Swelling 11/22/2011    Family History  Problem Relation Age of Onset  . Heart disease Maternal Grandmother   . Hyperlipidemia Maternal Grandmother   . Cancer Maternal Grandfather     Social History   Socioeconomic History  . Marital status: Single    Spouse name: Not on file  . Number of children: Not on file  . Years of education: Not on file  . Highest education level: Not on file  Occupational History  . Not on file  Social Needs  . Financial resource strain: Not on file  . Food insecurity:     Worry: Not on file    Inability: Not on file  . Transportation needs:    Medical: Not on file    Non-medical: Not on file  Tobacco Use  . Smoking status: Never Smoker  . Smokeless tobacco: Never Used  Substance and Sexual Activity  . Alcohol use: No  . Drug use: No  . Sexual activity: Not on file  Lifestyle  . Physical activity:    Days per week: Not on file    Minutes per session: Not on file  . Stress: Not on file  Relationships  .  Social connections:    Talks on phone: Not on file    Gets together: Not on file    Attends religious service: Not on file    Active member of club or organization: Not on file    Attends meetings of clubs or organizations: Not on file    Relationship status: Not on file  . Intimate partner violence:    Fear of current or ex partner: Not on file    Emotionally abused: Not on file    Physically abused: Not on file    Forced sexual activity: Not on file  Other Topics Concern  . Not on file  Social History Narrative   ** Merged History Encounter **        Review of Systems: All systems reviewed and negative except where noted in HPI.  Physical Exam: Vital signs in last 24 hours: Temp:  [98 F (36.7 C)] 98 F (36.7 C) (04/13 1245) Pulse Rate:  [51-124] 90 (04/14 0830) Resp:  [10-26] 10 (04/14 0830) BP: (93-131)/(44-92) 127/86 (04/14 0830) SpO2:  [95 %-100 %] 98 % (04/14 0830)   General:   Alert, well-developed,  female in NAD Psych:  Pleasant, cooperative. Normal mood and affect. Eyes:  Pupils equal, sclera clear, no icterus.   Conjunctiva pink. Ears:  Normal auditory acuity. Nose:  No deformity, discharge,  or lesions. Neck:  Supple; no masses Lungs:  Clear throughout to auscultation.   No wheezes, crackles, or rhonchi.  Heart:  Regular rate and rhythm; no murmurs, no edema Abdomen:  Soft, non-distended, nontender, BS active, no palp mass    Rectal:  Deferred  Msk:  Symmetrical without gross deformities. . Pulses:  Normal pulses  noted. Neurologic:  Alert and  oriented x4;  grossly normal neurologically. Skin:  Intact without significant lesions or rashes..   Intake/Output from previous day: No intake/output data recorded. Intake/Output this shift: No intake/output data recorded.  Lab Results: Recent Labs    07/29/17 1330 07/29/17 2001 07/30/17 0511  WBC 10.9* 14.1* 9.3  HGB 15.0 13.9 12.9  HCT 43.1 40.1 37.9  PLT 238 217 200   BMET Recent Labs    07/29/17 1330 07/30/17 0511  NA 135 137  K 3.5 3.6  CL 100* 107  CO2 23 21*  GLUCOSE 133* 92  BUN 11 9  CREATININE 0.62 0.67  CALCIUM 9.8 9.0   LFT Recent Labs    07/29/17 1330  PROT 8.1  ALBUMIN 5.0  AST 18  ALT 17  ALKPHOS 61  BILITOT 1.1   PT/INR Recent Labs    07/29/17 1622  LABPROT 13.9  INR 1.08   Hepatitis Panel No results for input(s): HEPBSAG, HCVAB, HEPAIGM, HEPBIGM in the last 72 hours.    Studies/Results: No results found.   Edman Circle MD @  07/30/2017, 10:02 AM

## 2017-07-30 NOTE — ED Notes (Signed)
Pt placed on hospital bed

## 2017-07-30 NOTE — Consult Note (Signed)
Referring Provider: [facility]  Primary Care Physician:  Patient, No Pcp Per Primary Gastroenterologist:  none  Reason for Consultation:  hematemesis    ASSESSMENT AND PLAN:   27 year old with hematemesis, epigastric pain in a patient with history of alcohol use and nonsteroidal use. Hemoglobin 12.9. Plan to proceed with EGD. Continue PPIs. Needs counseling regarding alcohol abuse. Normal liver function tests.  HPI: Julie Hughes is a 27 y.o. 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 GI has been consulted for possible further evaluation. She didnot have drop in hemoglobin. Hematemesis was witnessed in the emergency room.  NO Previous EGD.  She has been to the emergency room several times. She had an appointment with GI but did not go for the appointment.    Past Medical History:  Diagnosis Date  . Headache    occas. migraines    Past Surgical History:  Procedure Laterality Date  . MYRINGOTOMY      Prior to Admission medications   Medication Sig Start Date End Date Taking? Authorizing Provider  aspirin-acetaminophen-caffeine (EXCEDRIN MIGRAINE) [phone] MG tablet Take 2 tablets by mouth every 6 (six) hours as needed for headache or migraine.   Yes [provider]  Multiple Vitamin (MULTIVITAMIN WITH MINERALS) TABS tablet Take 1 tablet by mouth daily.   Yes [provider]  famotidine (PEPCID) 20 MG tablet Take 1 tablet (20 mg total) by mouth 2 (two) times daily. Patient not taking: Reported on 07/29/2017 06/11/17   Liberty Handy, PA-C  omeprazole (PRILOSEC) 20 MG capsule Take 1 capsule (20 mg total) by mouth daily. Patient not taking: Reported on 07/29/2017 06/11/17   Liberty Handy, PA-C  ondansetron (ZOFRAN) 4 MG tablet Take 1 tablet (4 mg total) by mouth every 8 (eight) hours as needed for nausea or vomiting. Patient not taking: Reported on 07/29/2017  07/19/16   Tilden Fossa, MD  promethazine (PHENERGAN) 25 MG tablet Take 1 tablet (25 mg total) by mouth every 6 (six) hours as needed for nausea or vomiting. Patient not taking: Reported on 07/29/2017 06/11/17   Liberty Handy, PA-C  sucralfate (CARAFATE) 1 GM/10ML suspension Take 10 mLs (1 g total) by mouth 4 (four) times daily -  with meals and at bedtime. Patient not taking: Reported on 07/29/2017 06/11/17   Liberty Handy, PA-C    Current Facility-Administered Medications  Medication Dose Route Frequency Provider Last Rate Last Dose  . 0.9 %  sodium chloride infusion   Intravenous Continuous Russella Dar, NP 150 mL/hr at 07/30/17 0334    . acetaminophen (TYLENOL) tablet 650 mg  650 mg Oral Q6H PRN Russella Dar, NP       Or  . acetaminophen (TYLENOL) suppository 650 mg  650 mg Rectal Q6H PRN Russella Dar, NP      . folic acid (FOLVITE) tablet 1 mg  1 mg Oral Daily Russella Dar, NP   1 mg at 07/29/17 1852  . LORazepam (ATIVAN) tablet 1 mg  1 mg Oral Q6H PRN Russella Dar, NP       Or  . LORazepam (ATIVAN) injection 1 mg  1 mg Intravenous Q6H PRN Russella Dar, NP      . morphine 4 MG/ML injection 1-4 mg  1-4 mg Intravenous Q2H PRN Russella Dar, NP      . multivitamin with minerals tablet 1 tablet  1 tablet Oral Daily  Russella DarEllis, Allison L, NP   1 tablet at 07/29/17 1852  . ondansetron (ZOFRAN) injection 4 mg  4 mg Intravenous Q6H PRN Russella DarEllis, Allison L, NP   4 mg at 07/30/17 0850   Or  . promethazine (PHENERGAN) injection 12.5-25 mg  12.5-25 mg Intravenous Q6H PRN Russella DarEllis, Allison L, NP   25 mg at 07/30/17 0917  . pantoprazole (PROTONIX) 80 mg in sodium chloride 0.9 % 250 mL (0.32 mg/mL) infusion  8 mg/hr Intravenous Continuous Russella DarEllis, Allison L, NP 25 mL/hr at 07/30/17 0333 8 mg/hr at 07/30/17 0333  . [START ON 08/02/2017] pantoprazole (PROTONIX) injection 40 mg  40 mg Intravenous Q12H Junious SilkEllis, Allison L, NP      . thiamine (VITAMIN B-1) tablet 100 mg  100 mg Oral Daily  Russella DarEllis, Allison L, NP   100 mg at 07/29/17 1853   Or  . thiamine (B-1) injection 100 mg  100 mg Intravenous Daily Russella DarEllis, Allison L, NP       Current Outpatient Medications  Medication Sig Dispense Refill  . aspirin-acetaminophen-caffeine (EXCEDRIN MIGRAINE) 250-250-65 MG tablet Take 2 tablets by mouth every 6 (six) hours as needed for headache or migraine.    . Multiple Vitamin (MULTIVITAMIN WITH MINERALS) TABS tablet Take 1 tablet by mouth daily.    . famotidine (PEPCID) 20 MG tablet Take 1 tablet (20 mg total) by mouth 2 (two) times daily. (Patient not taking: Reported on 07/29/2017) 30 tablet 0  . omeprazole (PRILOSEC) 20 MG capsule Take 1 capsule (20 mg total) by mouth daily. (Patient not taking: Reported on 07/29/2017) 15 capsule 0  . ondansetron (ZOFRAN) 4 MG tablet Take 1 tablet (4 mg total) by mouth every 8 (eight) hours as needed for nausea or vomiting. (Patient not taking: Reported on 07/29/2017) 10 tablet 0  . promethazine (PHENERGAN) 25 MG tablet Take 1 tablet (25 mg total) by mouth every 6 (six) hours as needed for nausea or vomiting. (Patient not taking: Reported on 07/29/2017) 30 tablet 0  . sucralfate (CARAFATE) 1 GM/10ML suspension Take 10 mLs (1 g total) by mouth 4 (four) times daily -  with meals and at bedtime. (Patient not taking: Reported on 07/29/2017) 420 mL 0    Allergies as of 07/29/2017 - Review Complete 07/29/2017  Allergen Reaction Noted  . Penicillins Hives and Swelling 11/22/2011    Family History  Problem Relation Age of Onset  . Heart disease Maternal Grandmother   . Hyperlipidemia Maternal Grandmother   . Cancer Maternal Grandfather     Social History   Socioeconomic History  . Marital status: Single    Spouse name: Not on file  . Number of children: Not on file  . Years of education: Not on file  . Highest education level: Not on file  Occupational History  . Not on file  Social Needs  . Financial resource strain: Not on file  . Food insecurity:     Worry: Not on file    Inability: Not on file  . Transportation needs:    Medical: Not on file    Non-medical: Not on file  Tobacco Use  . Smoking status: Never Smoker  . Smokeless tobacco: Never Used  Substance and Sexual Activity  . Alcohol use: No  . Drug use: No  . Sexual activity: Not on file  Lifestyle  . Physical activity:    Days per week: Not on file    Minutes per session: Not on file  . Stress: Not on file  Relationships  .  Social connections:    Talks on phone: Not on file    Gets together: Not on file    Attends religious service: Not on file    Active member of club or organization: Not on file    Attends meetings of clubs or organizations: Not on file    Relationship status: Not on file  . Intimate partner violence:    Fear of current or ex partner: Not on file    Emotionally abused: Not on file    Physically abused: Not on file    Forced sexual activity: Not on file  Other Topics Concern  . Not on file  Social History Narrative   ** Merged History Encounter **        Review of Systems: All systems reviewed and negative except where noted in HPI.  Physical Exam: Vital signs in last 24 hours: Temp:  [98 F (36.7 C)] 98 F (36.7 C) (04/13 1245) Pulse Rate:  [51-124] 90 (04/14 0830) Resp:  [10-26] 10 (04/14 0830) BP: (93-131)/(44-92) 127/86 (04/14 0830) SpO2:  [95 %-100 %] 98 % (04/14 0830)   General:   Alert, well-developed,  female in NAD Psych:  Pleasant, cooperative. Normal mood and affect. Eyes:  Pupils equal, sclera clear, no icterus.   Conjunctiva pink. Ears:  Normal auditory acuity. Nose:  No deformity, discharge,  or lesions. Neck:  Supple; no masses Lungs:  Clear throughout to auscultation.   No wheezes, crackles, or rhonchi.  Heart:  Regular rate and rhythm; no murmurs, no edema Abdomen:  Soft, non-distended, nontender, BS active, no palp mass    Rectal:  Deferred  Msk:  Symmetrical without gross deformities. . Pulses:  Normal pulses  noted. Neurologic:  Alert and  oriented x4;  grossly normal neurologically. Skin:  Intact without significant lesions or rashes..   Intake/Output from previous day: No intake/output data recorded. Intake/Output this shift: No intake/output data recorded.  Lab Results: Recent Labs    07/29/17 1330 07/29/17 2001 07/30/17 0511  WBC 10.9* 14.1* 9.3  HGB 15.0 13.9 12.9  HCT 43.1 40.1 37.9  PLT 238 217 200   BMET Recent Labs    07/29/17 1330 07/30/17 0511  NA 135 137  K 3.5 3.6  CL 100* 107  CO2 23 21*  GLUCOSE 133* 92  BUN 11 9  CREATININE 0.62 0.67  CALCIUM 9.8 9.0   LFT Recent Labs    07/29/17 1330  PROT 8.1  ALBUMIN 5.0  AST 18  ALT 17  ALKPHOS 61  BILITOT 1.1   PT/INR Recent Labs    07/29/17 1622  LABPROT 13.9  INR 1.08   Hepatitis Panel No results for input(s): HEPBSAG, HCVAB, HEPAIGM, HEPBIGM in the last 72 hours.    Studies/Results: No results found.   Edman Circle MD @  07/30/2017, 10:02 AM

## 2017-07-31 ENCOUNTER — Telehealth: Payer: Self-pay | Admitting: Gastroenterology

## 2017-07-31 ENCOUNTER — Inpatient Hospital Stay (HOSPITAL_COMMUNITY): Payer: Medicaid Other

## 2017-07-31 DIAGNOSIS — R7989 Other specified abnormal findings of blood chemistry: Secondary | ICD-10-CM

## 2017-07-31 DIAGNOSIS — R112 Nausea with vomiting, unspecified: Secondary | ICD-10-CM

## 2017-07-31 LAB — CBC WITH DIFFERENTIAL/PLATELET
BASOS ABS: 0 10*3/uL (ref 0.0–0.1)
BASOS PCT: 1 %
Basophils Absolute: 0 10*3/uL (ref 0.0–0.1)
Basophils Relative: 0 %
Eosinophils Absolute: 0.1 10*3/uL (ref 0.0–0.7)
Eosinophils Absolute: 0 10*3/uL (ref 0.0–0.7)
Eosinophils Relative: 1 %
Eosinophils Relative: 0 %
HCT: 35 % — ABNORMAL LOW (ref 36.0–46.0)
HEMATOCRIT: 37.9 % (ref 36.0–46.0)
Hemoglobin: 11.9 g/dL — ABNORMAL LOW (ref 12.0–15.0)
Hemoglobin: 13.2 g/dL (ref 12.0–15.0)
LYMPHS ABS: 1.4 10*3/uL (ref 0.7–4.0)
LYMPHS PCT: 15 %
Lymphocytes Relative: 36 %
Lymphs Abs: 2.7 10*3/uL (ref 0.7–4.0)
MCH: 30.4 pg (ref 26.0–34.0)
MCH: 30 pg (ref 26.0–34.0)
MCHC: 34 g/dL (ref 30.0–36.0)
MCHC: 34.8 g/dL (ref 30.0–36.0)
MCV: 89.5 fL (ref 78.0–100.0)
MCV: 86.1 fL (ref 78.0–100.0)
MONO ABS: 0.5 10*3/uL (ref 0.1–1.0)
MONOS PCT: 4 %
Monocytes Absolute: 0.4 10*3/uL (ref 0.1–1.0)
Monocytes Relative: 6 %
NEUTROS ABS: 7.9 10*3/uL — AB (ref 1.7–7.7)
NEUTROS PCT: 56 %
Neutro Abs: 4.1 10*3/uL (ref 1.7–7.7)
Neutrophils Relative %: 81 %
Platelets: 164 10*3/uL (ref 150–400)
Platelets: 198 10*3/uL (ref 150–400)
RBC: 3.91 MIL/uL (ref 3.87–5.11)
RBC: 4.4 MIL/uL (ref 3.87–5.11)
RDW: 12.7 % (ref 11.5–15.5)
RDW: 12.1 % (ref 11.5–15.5)
WBC: 7.3 10*3/uL (ref 4.0–10.5)
WBC: 9.7 10*3/uL (ref 4.0–10.5)

## 2017-07-31 LAB — COMPREHENSIVE METABOLIC PANEL
ALBUMIN: 3.4 g/dL — AB (ref 3.5–5.0)
ALT: 10 U/L — ABNORMAL LOW (ref 14–54)
AST: 13 U/L — AB (ref 15–41)
Alkaline Phosphatase: 42 U/L (ref 38–126)
Anion gap: 9 (ref 5–15)
BILIRUBIN TOTAL: 1.2 mg/dL (ref 0.3–1.2)
BUN: 6 mg/dL (ref 6–20)
CHLORIDE: 108 mmol/L (ref 101–111)
CO2: 20 mmol/L — AB (ref 22–32)
Calcium: 8.3 mg/dL — ABNORMAL LOW (ref 8.9–10.3)
Creatinine, Ser: 0.64 mg/dL (ref 0.44–1.00)
GFR calc Af Amer: >60 mL/min (ref >60)
GFR calc non Af Amer: >60 mL/min (ref >60)
GLUCOSE: 80 mg/dL (ref 65–99)
POTASSIUM: 3.4 mmol/L — AB (ref 3.5–5.1)
Sodium: 137 mmol/L (ref 135–145)
TOTAL PROTEIN: 5.5 g/dL — AB (ref 6.5–8.1)

## 2017-07-31 LAB — MAGNESIUM: Magnesium: 1.9 mg/dL (ref 1.7–2.4)

## 2017-07-31 LAB — PHOSPHORUS: Phosphorus: 3.1 mg/dL (ref 2.5–4.6)

## 2017-07-31 MED ORDER — SUCRALFATE 1 GM/10ML PO SUSP
1.0000 g | Freq: Three times a day (TID) | ORAL | Status: DC
  Administered 2017-07-31 – 2017-08-01 (×4): 1 g via ORAL
  Filled 2017-07-31 (×4): qty 10

## 2017-07-31 MED ORDER — IOPAMIDOL (ISOVUE-300) INJECTION 61%
100.0000 mL | Freq: Once | INTRAVENOUS | Status: AC | PRN
  Administered 2017-07-31: 100 mL via INTRAVENOUS

## 2017-07-31 MED ORDER — POTASSIUM CHLORIDE CRYS ER 20 MEQ PO TBCR
40.0000 meq | EXTENDED_RELEASE_TABLET | Freq: Two times a day (BID) | ORAL | Status: AC
  Administered 2017-07-31 (×2): 40 meq via ORAL
  Filled 2017-07-31 (×2): qty 2

## 2017-07-31 MED ORDER — IOPAMIDOL (ISOVUE-300) INJECTION 61%
INTRAVENOUS | Status: AC
  Filled 2017-07-31: qty 30

## 2017-07-31 MED ORDER — IOPAMIDOL (ISOVUE-300) INJECTION 61%
INTRAVENOUS | Status: AC
  Filled 2017-07-31: qty 100

## 2017-07-31 MED ORDER — POTASSIUM CHLORIDE 10 MEQ/100ML IV SOLN
10.0000 meq | INTRAVENOUS | Status: AC
  Administered 2017-07-31 (×3): 10 meq via INTRAVENOUS
  Filled 2017-07-31 (×3): qty 100

## 2017-07-31 MED ORDER — METOCLOPRAMIDE HCL 10 MG PO TABS
10.0000 mg | ORAL_TABLET | Freq: Three times a day (TID) | ORAL | Status: DC
  Administered 2017-07-31 – 2017-08-01 (×4): 10 mg via ORAL
  Filled 2017-07-31 (×4): qty 1

## 2017-07-31 NOTE — Progress Notes (Signed)
    Called by [facility] to see patient. We signed off yesterday. Patient having N/V and abdominal pain with PO intake. Patient leaving now for CT scan, I spoke with her briefly in room. We will follow up on CT scan and see in am.
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
## 2017-07-31 NOTE — Telephone Encounter (Signed)
Julie [facility] states order was put in for patient yesterday by Dr.Gupta for patient to be tested for Hpylori. Patient was seen by Dr.Gupta at hosp yesterday. Lab states order was canceled in system so they want to know if Dr.Gupta still wants test done or not. Please advise.

## 2017-07-31 NOTE — Telephone Encounter (Signed)
We still want H. Pylori on Bx. I did not cancel any labs/pathology.  I believe it was a CLOtest.

## 2017-07-31 NOTE — Telephone Encounter (Signed)
Dr. Gupta, please advise.  Thank you.

## 2017-08-01 LAB — CBC WITH DIFFERENTIAL/PLATELET
Basophils Absolute: 0 10*3/uL (ref 0.0–0.1)
Basophils Relative: 1 %
EOS ABS: 0.1 10*3/uL (ref 0.0–0.7)
EOS PCT: 1 %
HCT: 39.1 % (ref 36.0–46.0)
HEMOGLOBIN: 13.4 g/dL (ref 12.0–15.0)
LYMPHS ABS: 2.4 10*3/uL (ref 0.7–4.0)
Lymphocytes Relative: 30 %
MCH: 30 pg (ref 26.0–34.0)
MCHC: 34.3 g/dL (ref 30.0–36.0)
MCV: 87.7 fL (ref 78.0–100.0)
Monocytes Absolute: 0.5 10*3/uL (ref 0.1–1.0)
Monocytes Relative: 6 %
NEUTROS PCT: 62 %
Neutro Abs: 5.1 10*3/uL (ref 1.7–7.7)
PLATELETS: 185 10*3/uL (ref 150–400)
RBC: 4.46 MIL/uL (ref 3.87–5.11)
RDW: 12.5 % (ref 11.5–15.5)
WBC: 8.1 10*3/uL (ref 4.0–10.5)

## 2017-08-01 LAB — COMPREHENSIVE METABOLIC PANEL
ALBUMIN: 3.7 g/dL (ref 3.5–5.0)
ALK PHOS: 44 U/L (ref 38–126)
ALT: 12 U/L — AB (ref 14–54)
AST: 12 U/L — ABNORMAL LOW (ref 15–41)
Anion gap: 11 (ref 5–15)
BUN: <5 mg/dL — ABNORMAL LOW (ref 6–20)
CALCIUM: 8.9 mg/dL (ref 8.9–10.3)
CHLORIDE: 104 mmol/L (ref 101–111)
CO2: 20 mmol/L — AB (ref 22–32)
CREATININE: 0.63 mg/dL (ref 0.44–1.00)
GFR calc non Af Amer: >60 mL/min (ref >60)
GLUCOSE: 79 mg/dL (ref 65–99)
Potassium: 3.4 mmol/L — ABNORMAL LOW (ref 3.5–5.1)
SODIUM: 135 mmol/L (ref 135–145)
Total Bilirubin: 1.1 mg/dL (ref 0.3–1.2)
Total Protein: 6.3 g/dL — ABNORMAL LOW (ref 6.5–8.1)

## 2017-08-01 LAB — MAGNESIUM: Magnesium: 1.9 mg/dL (ref 1.7–2.4)

## 2017-08-01 LAB — LIPASE, BLOOD: Lipase: 28 U/L (ref 11–51)

## 2017-08-01 LAB — PHOSPHORUS: PHOSPHORUS: 2.8 mg/dL (ref 2.5–4.6)

## 2017-08-01 MED ORDER — POTASSIUM CHLORIDE CRYS ER 20 MEQ PO TBCR
40.0000 meq | EXTENDED_RELEASE_TABLET | Freq: Two times a day (BID) | ORAL | Status: DC
  Administered 2017-08-01: 40 meq via ORAL
  Filled 2017-08-01: qty 2

## 2017-08-01 MED ORDER — ONDANSETRON HCL 4 MG PO TABS: 4.0000 mg | ORAL_TABLET | Freq: Three times a day (TID) | ORAL | 0 refills | Status: DC | PRN

## 2017-08-01 MED ORDER — ACETAMINOPHEN 325 MG PO TABS: 650.0000 mg | ORAL_TABLET | Freq: Four times a day (QID) | ORAL | 0 refills | Status: AC | PRN

## 2017-08-01 MED ORDER — PANTOPRAZOLE SODIUM 40 MG PO TBEC: 40.0000 mg | DELAYED_RELEASE_TABLET | Freq: Every day | ORAL | 0 refills | Status: DC

## 2017-08-01 NOTE — Telephone Encounter (Signed)
See operative note Pl call Roselie AwkwardShannon Love RN [facility] I did get CLO test

## 2017-08-01 NOTE — Progress Notes (Signed)
Provided patient with resource of [facility] to follow up with. She has medicaid and will call number on card to get name of MD who is assigned to her, and names of physicians who are accepting patients if one is not.
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
## 2017-08-01 NOTE — Progress Notes (Signed)
Daily Rounding Note  08/01/2017, 9:51 AM  LOS: 2 days   SUBJECTIVE:   Chief complaint: nausea and epigastric pain.    Both are better.  Tolerating fruit and coffee this AM.  Pain subsided.    OBJECTIVE:         Vital signs in last 24 hours:    Temp:  [98 F (36.7 C)-99.3 F (37.4 C)] 98 F (36.7 C) (04/16 0544) Pulse Rate:  [67-82] 67 (04/16 0544) Resp:  [18] 18 (04/16 0544) BP: (115-119)/(75-78) 115/78 (04/16 0544) SpO2:  [98 %-100 %] 100 % (04/16 0544) Weight:  [149 lb 3.2 oz (67.7 kg)] 149 lb 3.2 oz (67.7 kg) (04/16 0544) Last BM Date: 07/30/17 Filed Weights   07/30/17 1558 07/31/17 0650 08/01/17 0544  Weight: 150 lb 9.6 oz (68.3 kg) 153 lb 14.4 oz (69.8 kg) 149 lb 3.2 oz (67.7 kg)   General: comfortable, alert.  Looks well   Heart: RRR Chest: clear bil.   Abdomen: soft, active BS.  Minor epigastric abd pain.    Extremities: no CCE Neuro/Psych:  Oriented x 3.  No gross deficits.    Intake/Output from previous day: 04/15 0701 - 04/16 0700 In: 2932.1 [P.O.:720; I.V.:1912.1; IV Piggyback:300] Out: -   Intake/Output this shift: No intake/output data recorded.  Lab Results: Recent Labs    07/31/17 0522 07/31/17 1706 08/01/17 0647  WBC 7.3 9.7 8.1  HGB 11.9* 13.2 13.4  HCT 35.0* 37.9 39.1  PLT 164 198 185   BMET Recent Labs    07/29/17 1330 07/30/17 0511 07/31/17 0522  NA 135 137 137  K 3.5 3.6 3.4*  CL 100* 107 108  CO2 23 21* 20*  GLUCOSE 133* 92 80  BUN 11 9 6   CREATININE 0.62 0.67 0.64  CALCIUM 9.8 9.0 8.3*   LFT Recent Labs    07/29/17 1330 07/31/17 0522  PROT 8.1 5.5*  ALBUMIN 5.0 3.4*  AST 18 13*  ALT 17 10*  ALKPHOS 61 42  BILITOT 1.1 1.2   PT/INR Recent Labs    07/29/17 1622  LABPROT 13.9  INR 1.08   Hepatitis Panel No results for input(s): HEPBSAG, HCVAB, HEPAIGM, HEPBIGM in the last 72 hours.  Studies/Results: Ct Abdomen Pelvis W Contrast  Result Date:  07/31/2017 CLINICAL DATA:  Hematemesis EXAM: CT ABDOMEN AND PELVIS WITH CONTRAST TECHNIQUE: Multidetector CT imaging of the abdomen and pelvis was performed using the standard protocol following bolus administration of intravenous contrast. CONTRAST:  100mL ISOVUE-300 IOPAMIDOL (ISOVUE-300) INJECTION 61% COMPARISON:  12/19/2013 FINDINGS: Lower chest: No acute abnormality. Hepatobiliary: No focal liver abnormality is seen. No gallstones, gallbladder wall thickening, or biliary dilatation. Pancreas: Unremarkable. No pancreatic ductal dilatation or surrounding inflammatory changes. Spleen: Normal in size without focal abnormality. Adrenals/Urinary Tract: Adrenal glands are unremarkable. Kidneys are normal, without renal calculi, focal lesion, or hydronephrosis. Bladder is unremarkable. Stomach/Bowel: Stomach is within normal limits. Appendix appears normal. No evidence of bowel wall thickening, distention, or inflammatory changes. Vascular/Lymphatic: No significant vascular findings are present. No enlarged abdominal or pelvic lymph nodes. Reproductive: Uterus is within normal limits. Cystic changes are noted within the ovaries bilaterally. Dominant cyst measuring 2.0 cm is noted in the left ovary. This was not as well appreciated on the prior exam. Other: No abdominal wall hernia or abnormality. No abdominopelvic ascites. Musculoskeletal: No acute or significant osseous findings. IMPRESSION: 2 cm left ovarian cyst. No other focal abnormality is noted. Electronically Signed   By:  Alcide Clever M.D.   On: 07/31/2017 17:06   Scheduled Meds: . folic acid  1 mg Oral Daily  . metoCLOPramide  10 mg Oral TID AC  . multivitamin with minerals  1 tablet Oral Daily  . pantoprazole  40 mg Oral Daily  . sucralfate  1 g Oral TID WC & HS  . thiamine  100 mg Oral Daily   Or  . thiamine  100 mg Intravenous Daily   Continuous Infusions: . sodium chloride 75 mL/hr at 07/31/17 2150   PRN Meds:.acetaminophen **OR**  acetaminophen, LORazepam **OR** LORazepam, morphine injection, ondansetron (ZOFRAN) IV **OR** promethazine, traMADol   ASSESMENT:   *   Hematemesis in setting of intermittent heavy excedrin use but no ETOH since told she had gastritis and treated with Carafate at ED visit priot to 05/2017 4/14 EGD:  Mild esophagitis, gastritis.   N/V, abdominal pain persisted but now resolved.   CT abd/pelvis with left ovarian cyst.   Reglan AC, daily Protonix. Carafate AC/HS, prn Ultram, prn zofran and phenergan.  No anemia.       PLAN   *  Do not see results of any clotest.  Apparently inadvertently cancelled in order reconcilliation after procedure so was never run.  Will order serum h pylori.  Stopped q 12 CBC, with stable cbc she does not need this.   Stopped reglan and carafate.   We will reach out to her with results of H Pylori testing.  She can also access results through my chart.  Does not need ROV with GI unless having unresolved GI sxs.  Dr Chales Abrahams is her GI MD going forward, I provided office # to pt.   At discharge: Protonix or other PPI, full strength/40 mg daily for 6 weeks then stop if doing well. Protonix is safer for lactating moms.   Ok to discharge home.      Jennye Moccasin  08/01/2017, 9:51 AM Phone [phone]

## 2017-08-01 NOTE — Progress Notes (Signed)
Discharged to home, boyfriend at bedside. D/c instructions   discussed with patient verbalized understanding. Work note given.PIV removed no s/s of infiltration or swelling noted.  Patient discharged ambulatory, refuse wheelchair.

## 2017-08-01 NOTE — Telephone Encounter (Signed)
I spoke with Pathology at [facility] and they say they do not have a specimen and that the order was "cancelled by the provider".  I also spoke with the lab and they report that blood was collected this morning for a H.Pylori, IgM, IgG, IgA AB.

## 2017-08-02 LAB — H PYLORI, IGM, IGG, IGA AB
H. Pylogi, Iga Abs: <9 units (ref 0.0–8.9)
H. Pylogi, Igm Abs: <9 units (ref 0.0–8.9)

## 2017-08-02 LAB — CLOTEST (H. PYLORI), BIOPSY: HELICOBACTER SCREEN: NEGATIVE

## 2017-08-02 NOTE — Telephone Encounter (Signed)
I spoke with Carollee HerterShannon and she was able to locate the specimen.  They are re-placing the order.  She was not sure what happened but you will have results.

## 2017-08-07 ENCOUNTER — Telehealth: Payer: Self-pay

## 2017-08-07 NOTE — Telephone Encounter (Signed)
I left her a message that her biopsy result was negative.

## 2017-11-08 DIAGNOSIS — R7989 Other specified abnormal findings of blood chemistry: Secondary | ICD-10-CM | POA: Diagnosis not present

## 2017-11-08 DIAGNOSIS — R110 Nausea: Secondary | ICD-10-CM | POA: Diagnosis not present

## 2017-11-08 DIAGNOSIS — F411 Generalized anxiety disorder: Secondary | ICD-10-CM | POA: Diagnosis not present

## 2017-11-08 DIAGNOSIS — Z8719 Personal history of other diseases of the digestive system: Secondary | ICD-10-CM | POA: Diagnosis not present

## 2017-11-08 DIAGNOSIS — N83202 Unspecified ovarian cyst, left side: Secondary | ICD-10-CM | POA: Diagnosis not present

## 2017-11-09 ENCOUNTER — Encounter: Payer: Self-pay | Admitting: Gastroenterology

## 2017-12-11 DIAGNOSIS — Z124 Encounter for screening for malignant neoplasm of cervix: Secondary | ICD-10-CM | POA: Diagnosis not present

## 2017-12-11 DIAGNOSIS — F411 Generalized anxiety disorder: Secondary | ICD-10-CM | POA: Diagnosis not present

## 2017-12-11 DIAGNOSIS — R51 Headache: Secondary | ICD-10-CM | POA: Diagnosis not present

## 2017-12-11 DIAGNOSIS — Z13220 Encounter for screening for lipoid disorders: Secondary | ICD-10-CM | POA: Diagnosis not present

## 2017-12-11 DIAGNOSIS — Z0000 Encounter for general adult medical examination without abnormal findings: Secondary | ICD-10-CM | POA: Diagnosis not present

## 2017-12-26 ENCOUNTER — Encounter: Payer: Self-pay | Admitting: Gastroenterology

## 2017-12-26 ENCOUNTER — Ambulatory Visit (INDEPENDENT_AMBULATORY_CARE_PROVIDER_SITE_OTHER): Payer: Medicaid Other | Admitting: Gastroenterology

## 2017-12-26 VITALS — BP 126/64 | HR 72 | Ht 63.0 in | Wt 146.0 lb

## 2017-12-26 DIAGNOSIS — K2100 Gastro-esophageal reflux disease with esophagitis, without bleeding: Secondary | ICD-10-CM

## 2017-12-26 MED ORDER — PANTOPRAZOLE SODIUM 40 MG PO TBEC: 40.0000 mg | DELAYED_RELEASE_TABLET | Freq: Every day | ORAL | 11 refills | Status: DC

## 2017-12-26 NOTE — Progress Notes (Signed)
Chief Complaint: FU  Referring Provider:  Dr Herold Harms      ASSESSMENT AND PLAN;   #1.  GERD with Erosive esophagitis - Protonix 40mg  po qd x 12 weeks, then can try QOD. - Brochures regarding GERD - FU in 6 months.   HPI:    Julie Hughes is a 27 y.o. female  FU from admission from [facility] 07/2017 due to upper GI bleeding.  EGD showed erosive esophagitis.  Patient was started on Protonix with good results.  She stopped when she ran out of prescription. Started having symptoms of heartburn again. No odynophagia or dysphagia No further bleeding Denies having any significant epigastric discomfort Had negative CT during previous admission CLOtest was negative Most recent hemoglobin was normal 11/08/2017-13.3   Past Medical History:  Diagnosis Date  . Headache    occas. migraines    Past Surgical History:  Procedure Laterality Date  . ESOPHAGOGASTRODUODENOSCOPY N/A 07/30/2017   Procedure: ESOPHAGOGASTRODUODENOSCOPY (EGD);  Surgeon: Lynann Bologna, MD;  Location: [facility] ENDOSCOPY;  Service: Endoscopy;  Laterality: N/A;  . MYRINGOTOMY      Family History  Problem Relation Age of Onset  . Heart disease Maternal Grandmother   . Hyperlipidemia Maternal Grandmother   . Cancer Maternal Grandfather   . Colon cancer Neg Hx     Social History   Tobacco Use  . Smoking status: Never Smoker  . Smokeless tobacco: Never Used  Substance Use Topics  . Alcohol use: No  . Drug use: No    Current Outpatient Medications  Medication Sig Dispense Refill  . acetaminophen (TYLENOL) 325 MG tablet Take 2 tablets (650 mg total) by mouth every 6 (six) hours as needed for mild pain (or Fever >/= 101). 30 tablet 0  . Multiple Vitamin (MULTIVITAMIN WITH MINERALS) TABS tablet Take 1 tablet by mouth daily.     No current facility-administered medications for this visit.     Allergies  Allergen Reactions  . Penicillins Hives and Swelling    Bruises at site of injection Has patient had a PCN reaction  causing immediate rash, facial/tongue/throat swelling, SOB or lightheadedness with hypotension: Yes Has patient had a PCN reaction causing severe rash involving mucus membranes or skin necrosis: No Has patient had a PCN reaction that required hospitalization: patient not sure. Has patient had a PCN reaction occurring within the last 10 years: No If all of the above answers are "NO", then may proceed with Cephalosporin use.    Review of Systems:  Constitutional: Denies fever, chills, diaphoresis, appetite change and fatigue.  HEENT: Denies photophobia, eye pain, redness, hearing loss, ear pain, congestion, sore throat, rhinorrhea, sneezing, mouth sores, neck pain, neck stiffness and tinnitus.   Respiratory: Denies SOB, DOE, cough, chest tightness,  and wheezing.   Cardiovascular: Denies chest pain, palpitations and leg swelling.  Genitourinary: Denies dysuria, urgency, frequency, hematuria, flank pain and difficulty urinating.  Musculoskeletal: Denies myalgias, back pain, joint swelling, arthralgias and gait problem.  Skin: No rash.  Neurological: Denies dizziness, seizures, syncope, weakness, light-headedness, numbness and headaches.  Hematological: Denies adenopathy. Easy bruising, personal or family bleeding history  Psychiatric/Behavioral: No anxiety or depression     Physical Exam:    BP 126/64   Pulse 72   Ht 5\' 3"  (1.6 m)   Wt 146 lb (66.2 kg)   BMI 25.86 kg/m  Filed Weights   12/26/17 1049  Weight: 146 lb (66.2 kg)   Constitutional:  Well-developed, in no acute distress. Psychiatric: Normal mood and affect.  Behavior is normal. HEENT: Pupils normal.  Conjunctivae are normal. No scleral icterus. Neck supple.  Cardiovascular: Normal rate, regular rhythm. No edema Pulmonary/chest: Effort normal and breath sounds normal. No wheezing, rales or rhonchi. Abdominal: Soft, nondistended. Nontender. Bowel sounds active throughout. There are no masses palpable. No  hepatomegaly. Rectal:  defered Neurological: Alert and oriented to person place and time. Skin: Skin is warm and dry. No rashes noted.  Data Reviewed: I have personally reviewed following labs and imaging studies  CBC: CBC Latest Ref Rng & Units 08/01/2017 07/31/2017 07/31/2017  WBC 4.0 - 10.5 K/uL 8.1 9.7 7.3  Hemoglobin 12.0 - 15.0 g/dL 16.1 09.6 11.9(L)  Hematocrit 36.0 - 46.0 % 39.1 37.9 35.0(L)  Platelets 150 - 400 K/uL 185 198 164    CMP: CMP Latest Ref Rng & Units 08/01/2017 07/31/2017 07/30/2017  Glucose 65 - 99 mg/dL 79 80 92  BUN 6 - 20 mg/dL <0(A) 6 9  Creatinine 5.40 - 1.00 mg/dL 9.81 1.91 4.78  Sodium 135 - 145 mmol/L 135 137 137  Potassium 3.5 - 5.1 mmol/L 3.4(L) 3.4(L) 3.6  Chloride 101 - 111 mmol/L 104 108 107  CO2 22 - 32 mmol/L 20(L) 20(L) 21(L)  Calcium 8.9 - 10.3 mg/dL 8.9 2.9(F) 9.0  Total Protein 6.5 - 8.1 g/dL 6.3(L) 5.5(L) -  Total Bilirubin 0.3 - 1.2 mg/dL 1.1 1.2 -  Alkaline Phos 38 - 126 U/L 44 42 -  AST 15 - 41 U/L 12(L) 13(L) -  ALT 14 - 54 U/L 12(L) 10(L) -     Edman Circle, MD 12/26/2017, 11:01 AM  Cc: Dr Herold Harms

## 2017-12-26 NOTE — Patient Instructions (Signed)
If you are age 25 or older, your body mass index should be between 23-30. Your Body mass index is 25.86 kg/m. If this is out of the aforementioned range listed, please consider follow up with your Primary Care Provider.  If you are age 65 or younger, your body mass index should be between 19-25. Your Body mass index is 25.86 kg/m. If this is out of the aformentioned range listed, please consider follow up with your Primary Care Provider.   We have sent the following medications to your pharmacy for you to pick up at your convenience: Pantoprazole 40 mg once daily.   Thank you,  Dr. Lynann Bologna

## 2018-01-22 DIAGNOSIS — F411 Generalized anxiety disorder: Secondary | ICD-10-CM | POA: Diagnosis not present

## 2018-01-22 DIAGNOSIS — Z79899 Other long term (current) drug therapy: Secondary | ICD-10-CM | POA: Diagnosis not present

## 2018-02-28 DIAGNOSIS — R112 Nausea with vomiting, unspecified: Secondary | ICD-10-CM | POA: Diagnosis not present

## 2018-02-28 DIAGNOSIS — Z8719 Personal history of other diseases of the digestive system: Secondary | ICD-10-CM | POA: Diagnosis not present

## 2018-02-28 DIAGNOSIS — R197 Diarrhea, unspecified: Secondary | ICD-10-CM | POA: Diagnosis not present

## 2018-05-30 DIAGNOSIS — F411 Generalized anxiety disorder: Secondary | ICD-10-CM | POA: Diagnosis not present

## 2018-05-30 DIAGNOSIS — N83202 Unspecified ovarian cyst, left side: Secondary | ICD-10-CM | POA: Diagnosis not present

## 2018-05-30 DIAGNOSIS — Z79899 Other long term (current) drug therapy: Secondary | ICD-10-CM | POA: Diagnosis not present

## 2018-06-01 DIAGNOSIS — N83202 Unspecified ovarian cyst, left side: Secondary | ICD-10-CM | POA: Diagnosis not present

## 2018-06-01 DIAGNOSIS — N926 Irregular menstruation, unspecified: Secondary | ICD-10-CM | POA: Diagnosis not present

## 2018-06-01 DIAGNOSIS — N946 Dysmenorrhea, unspecified: Secondary | ICD-10-CM | POA: Diagnosis not present

## 2018-06-19 ENCOUNTER — Ambulatory Visit
Admission: EM | Admit: 2018-06-19 | Discharge: 2018-06-19 | Disposition: A | Discharge status: Home / Self Care | Payer: Medicaid Other | Attending: Physician Assistant | Admitting: Physician Assistant

## 2018-06-19 DIAGNOSIS — R112 Nausea with vomiting, unspecified: Secondary | ICD-10-CM

## 2018-06-19 HISTORY — DX: Panic disorder (episodic paroxysmal anxiety): F41.0

## 2018-06-19 HISTORY — DX: Other complications of gastrostomy: K94.29

## 2018-06-19 MED ORDER — ONDANSETRON 4 MG PO TBDP: 4.0000 mg | ORAL_TABLET | Freq: Three times a day (TID) | ORAL | 0 refills | Status: AC | PRN

## 2018-06-19 MED ORDER — ONDANSETRON HCL 4 MG/2ML IJ SOLN
4.0000 mg | Freq: Once | INTRAMUSCULAR | Status: AC
  Administered 2018-06-19: 4 mg via INTRAMUSCULAR

## 2018-06-19 MED ORDER — ONDANSETRON 4 MG PO TBDP
4.0000 mg | ORAL_TABLET | Freq: Once | ORAL | Status: AC
  Administered 2018-06-19: 4 mg via ORAL

## 2018-06-19 NOTE — ED Provider Notes (Signed)
[facility]    CSN: [id number] Arrival date & time: 06/19/18  1337     History   Chief Complaint Chief Complaint  Patient presents with  . Emesis    HPI Julie Hughes is a 28 y.o. female.   28 year old female with history of alcoholic gastritis, upper GI bleed, stomach ulcers comes in for 4 to 5-day history of URI symptoms.  Has had subjective fever, chills, body aches.  States has had nausea with vomiting, and has not been able to tolerate food were drink.  Has had blood mixed in with vomiting.  Denies coffee-ground emesis, melena, hematochezia.  Has had generalized abdominal pain that has been intermittent, worse with vomiting.  Denies diarrhea, constipation.  Denies history of abdominal surgeries.  Never smoker.     Past Medical History:  Diagnosis Date  . Headache    occas. migraines  . Hernia of gastrostomy tube site (HCC)   . Panic attacks     Patient Active Problem List   Diagnosis Date Noted  . Upper GI bleed 07/30/2017  . Acute esophagitis   . Hematemesis 07/29/2017  . Headache, chronic daily 07/29/2017  . Acute hyperglycemia 07/29/2017  . History of alcohol use 07/29/2017  . History of alcoholic gastritis 07/29/2017  . NSVD (normal spontaneous vaginal delivery) 08/28/2015  . Active labor 08/27/2015  . Nausea with vomiting 07/03/2015    Past Surgical History:  Procedure Laterality Date  . ESOPHAGOGASTRODUODENOSCOPY N/A 07/30/2017   Procedure: ESOPHAGOGASTRODUODENOSCOPY (EGD);  Surgeon: Lynann Bologna, MD;  Location: [facility] ENDOSCOPY;  Service: Endoscopy;  Laterality: N/A;  . MYRINGOTOMY      OB History    Gravida  2   Para  2   Term  2   Preterm      AB      Living  2     SAB      TAB      Ectopic      Multiple  0   Live Births  2            Home Medications    Prior to Admission medications   Medication Sig Start Date End Date Taking? Authorizing Provider  acetaminophen (TYLENOL) 325 MG tablet Take 2 tablets (650 mg  total) by mouth every 6 (six) hours as needed for mild pain (or Fever >/= 101). 08/01/17   Marguerita Merles Latif, DO  Multiple Vitamin (MULTIVITAMIN WITH MINERALS) TABS tablet Take 1 tablet by mouth daily.    [provider]  ondansetron (ZOFRAN ODT) 4 MG disintegrating tablet Take 1 tablet (4 mg total) by mouth every 8 (eight) hours as needed for nausea or vomiting. 06/19/18   Cathie Hoops, Amy V, PA-C  pantoprazole (PROTONIX) 40 MG tablet Take 1 tablet (40 mg total) by mouth daily. 12/26/17   Lynann Bologna, MD    Family History Family History  Problem Relation Age of Onset  . Heart disease Maternal Grandmother   . Hyperlipidemia Maternal Grandmother   . Cancer Maternal Grandfather   . Colon cancer Neg Hx     Social History Social History   Tobacco Use  . Smoking status: Never Smoker  . Smokeless tobacco: Never Used  Substance Use Topics  . Alcohol use: No  . Drug use: No     Allergies   Penicillins   Review of Systems Review of Systems  Reason unable to perform ROS: See HPI as above.     Physical Exam Triage Vital Signs ED Triage  Vitals  Enc Vitals Group     BP 06/19/18 1402 (!) 138/95     Pulse Rate 06/19/18 1402 67     Resp 06/19/18 1402 20     Temp 06/19/18 1402 98.8 F (37.1 C)     Temp Source 06/19/18 1402 Oral     SpO2 06/19/18 1402 99 %     Weight --      Height --      Head Circumference --      Peak Flow --      Pain Score 06/19/18 1403 7     Pain Loc --      Pain Edu? --      Excl. in GC? --    No data found.  Updated Vital Signs BP (!) 138/95 (BP Location: Left Arm)   Pulse 67   Temp 98.8 F (37.1 C) (Oral)   Resp 20   LMP 05/30/2018   SpO2 99%   Physical Exam Constitutional:      General: She is not in acute distress.    Appearance: She is well-developed. She is not ill-appearing, toxic-appearing or diaphoretic.  HENT:     Head: Normocephalic and atraumatic.     Right Ear: Tympanic membrane, ear canal and external ear normal. Tympanic  membrane is not erythematous or bulging.     Left Ear: Tympanic membrane, ear canal and external ear normal. Tympanic membrane is not erythematous or bulging.     Nose: Nose normal.     Right Sinus: No maxillary sinus tenderness or frontal sinus tenderness.     Left Sinus: No maxillary sinus tenderness or frontal sinus tenderness.     Mouth/Throat:     Mouth: Mucous membranes are moist.     Pharynx: Oropharynx is clear. Uvula midline.  Eyes:     Conjunctiva/sclera: Conjunctivae normal.     Pupils: Pupils are equal, round, and reactive to light.  Neck:     Musculoskeletal: Normal range of motion and neck supple.  Cardiovascular:     Rate and Rhythm: Normal rate and regular rhythm.     Heart sounds: Normal heart sounds. No murmur. No friction rub. No gallop.   Pulmonary:     Effort: Pulmonary effort is normal. No accessory muscle usage, prolonged expiration, respiratory distress or retractions.     Breath sounds: Normal breath sounds. No stridor, decreased air movement or transmitted upper airway sounds. No decreased breath sounds, wheezing, rhonchi or rales.  Abdominal:     General: Bowel sounds are normal.     Palpations: Abdomen is soft.     Comments: Generalized tenderness to palpation without guarding or rebound.  Skin:    General: Skin is warm and dry.  Neurological:     Mental Status: She is alert and oriented to person, place, and time.      UC Treatments / Results  Labs (all labs ordered are listed, but only abnormal results are displayed) Labs Reviewed - No data to display  EKG None  Radiology No results found.  Procedures Procedures (including critical care time)  Medications Ordered in UC Medications  ondansetron (ZOFRAN-ODT) disintegrating tablet 4 mg (4 mg Oral Given 06/19/18 1405)  ondansetron (ZOFRAN) injection 4 mg (4 mg Intramuscular Given 06/19/18 1436)    Initial Impression / Assessment and Plan / UC Course  I have reviewed the triage vital signs and  the nursing notes.  Pertinent labs & imaging results that were available during my care of the patient were reviewed  by me and considered in my medical decision making (see chart for details).    Patient states nausea but not controlled with Zofran ODT 4 mg, will provide Zofran 4 mg IM and reevaluate.  Patient with relief of nausea after Zofran IM, tolerating fluid intake.  Patient without alarming signs on exam.  She is afebrile, without tachycardia, hypotension.  Generalized abdominal tenderness on palpation without rebound or guarding.  Positive bowel sounds in all quadrants.  She has mild blood with vomiting without coffee-ground emesis, melena.  Will have patient continue Zofran for symptomatic relief and monitor closely.  Strict return precautions given.  Patient expresses understanding and agrees to plan.  Final Clinical Impressions(s) / UC Diagnoses   Final diagnoses:  Intractable vomiting with nausea, unspecified vomiting type    ED Prescriptions    Medication Sig Dispense Auth. Provider   ondansetron (ZOFRAN ODT) 4 MG disintegrating tablet Take 1 tablet (4 mg total) by mouth every 8 (eight) hours as needed for nausea or vomiting. 20 tablet Threasa Alpha, [place] 06/19/18 [phone]

## 2018-06-19 NOTE — Discharge Instructions (Addendum)
Zofran for nausea and vomiting as needed, we gave you zofran here, so you will not be able to take the second dose for another 8 hours. Keep hydrated, you urine should be clear to pale yellow in color. Bland diet, advance as tolerated. Monitor for any worsening of symptoms, nausea or vomiting not controlled by medication, worsening abdominal pain, coffee grind vomiting, black tarry stool, passing out, go to the emergency department for further evaluation needed.

## 2018-06-19 NOTE — ED Triage Notes (Signed)
Pt c/o n/v with fever, chills and body aches since Friday, can't keep anything down.

## 2018-06-29 ENCOUNTER — Ambulatory Visit: Payer: Medicaid Other | Admitting: Gastroenterology

## 2018-07-03 ENCOUNTER — Ambulatory Visit (INDEPENDENT_AMBULATORY_CARE_PROVIDER_SITE_OTHER): Payer: Medicaid Other | Admitting: Gastroenterology

## 2018-07-03 ENCOUNTER — Other Ambulatory Visit: Payer: Self-pay

## 2018-07-03 ENCOUNTER — Encounter: Payer: Self-pay | Admitting: Gastroenterology

## 2018-07-03 VITALS — BP 106/60 | HR 72 | Temp 98.2°F | Ht 63.0 in | Wt 159.0 lb

## 2018-07-03 DIAGNOSIS — K2100 Gastro-esophageal reflux disease with esophagitis, without bleeding: Secondary | ICD-10-CM

## 2018-07-03 MED ORDER — PANTOPRAZOLE SODIUM 40 MG PO TBEC: 40.0000 mg | DELAYED_RELEASE_TABLET | Freq: Every day | ORAL | 11 refills | Status: DC

## 2018-07-03 NOTE — Patient Instructions (Addendum)
If you are age 26 or older, your body mass index should be between 23-30. Your Body mass index is 28.17 kg/m. If this is out of the aforementioned range listed, please consider follow up with your Primary Care Provider.  If you are age 40 or younger, your body mass index should be between 19-25. Your Body mass index is 28.17 kg/m. If this is out of the aformentioned range listed, please consider follow up with your Primary Care Provider.   We have sent the following medications to your pharmacy for you to pick up at your convenience: Protonix  Please call Dr. Donne Hazel nurse in 2 weeks at [phone]  to let her now how you are doing.    Thank you,  Dr. Lynann Bologna

## 2018-07-03 NOTE — Progress Notes (Signed)
Chief Complaint: FU  Referring Provider:  No ref. provider found      ASSESSMENT AND PLAN;   #1. UGI bleed d/t erosive esophagitis (resolved)  Plan: - Try protonix 40mg  po qod. - Call in 4 weeks.  If she tolerates, can reduce Protonix to 20 mg p.o. once a day.  We will call in a prescription with 11 refills. - Nonpharmacologic means of reflux control was stressed. - FU in 1 yr. Earlier, if any problems. HPI:    Julie Hughes is a 28 y.o. female  For follow-up visit Had upper GI bleeding 07/2017, EGD showed erosive esophagitis She has been on Protonix with good results.  Had an episode of nausea/vomiting when she had upper respiratory tract infection.  Treated with Zofran.  No further problems.  Denies having any diarrhea or constipation.   Past Medical History:  Diagnosis Date  . Headache    occas. migraines  . Hernia of gastrostomy tube site (HCC)   . Panic attacks     Past Surgical History:  Procedure Laterality Date  . ESOPHAGOGASTRODUODENOSCOPY N/A 07/30/2017   Procedure: ESOPHAGOGASTRODUODENOSCOPY (EGD);  Surgeon: Lynann Bologna, MD;  Location: [facility] ENDOSCOPY;  Service: Endoscopy;  Laterality: N/A;  . MYRINGOTOMY      Family History  Problem Relation Age of Onset  . Heart disease Maternal Grandmother   . Hyperlipidemia Maternal Grandmother   . Cancer Maternal Grandfather   . Colon cancer Neg Hx     Social History   Tobacco Use  . Smoking status: Never Smoker  . Smokeless tobacco: Never Used  Substance Use Topics  . Alcohol use: No  . Drug use: No    Current Outpatient Medications  Medication Sig Dispense Refill  . acetaminophen (TYLENOL) 325 MG tablet Take 2 tablets (650 mg total) by mouth every 6 (six) hours as needed for mild pain (or Fever >/= 101). 30 tablet 0  . Multiple Vitamin (MULTIVITAMIN WITH MINERALS) TABS tablet Take 1 tablet by mouth daily.    . ondansetron (ZOFRAN ODT) 4 MG disintegrating tablet Take 1 tablet (4 mg total) by mouth every  8 (eight) hours as needed for nausea or vomiting. 20 tablet 0  . pantoprazole (PROTONIX) 40 MG tablet Take 1 tablet (40 mg total) by mouth daily. 30 tablet 11   No current facility-administered medications for this visit.     Allergies  Allergen Reactions  . Penicillins Hives and Swelling    Bruises at site of injection Has patient had a PCN reaction causing immediate rash, facial/tongue/throat swelling, SOB or lightheadedness with hypotension: Yes Has patient had a PCN reaction causing severe rash involving mucus membranes or skin necrosis: No Has patient had a PCN reaction that required hospitalization: patient not sure. Has patient had a PCN reaction occurring within the last 10 years: No If all of the above answers are "NO", then may proceed with Cephalosporin use.    Review of Systems:  Constitutional: Denies fever, chills, diaphoresis, appetite change and fatigue.  HEENT: Denies photophobia, eye pain, redness, hearing loss, ear pain, congestion, sore throat, rhinorrhea, sneezing, mouth sores, neck pain, neck stiffness and tinnitus.   Respiratory: Denies SOB, DOE, cough, chest tightness,  and wheezing.   Cardiovascular: Denies chest pain, palpitations and leg swelling.  Genitourinary: Denies dysuria, urgency, frequency, hematuria, flank pain and difficulty urinating.  Musculoskeletal: Denies myalgias, back pain, joint swelling, arthralgias and gait problem.  Skin: No rash.  Neurological: Denies dizziness, seizures, syncope, weakness, light-headedness, numbness and headaches.  Hematological: Denies adenopathy. Easy bruising, personal or family bleeding history  Psychiatric/Behavioral: No anxiety or depression     Physical Exam:    BP 106/60   Pulse 72   Temp 98.2 F (36.8 C)   Ht 5\' 3"  (1.6 m)   Wt 159 lb (72.1 kg)   BMI 28.17 kg/m  Filed Weights   07/03/18 1351  Weight: 159 lb (72.1 kg)   Constitutional:  Well-developed, in no acute distress. Psychiatric: Normal mood  and affect. Behavior is normal. HEENT: Pupils normal.  Conjunctivae are normal. No scleral icterus. Neck supple.  Cardiovascular: Normal rate, regular rhythm. No edema Pulmonary/chest: Effort normal and breath sounds normal. No wheezing, rales or rhonchi. Abdominal: Soft, nondistended. Nontender. Bowel sounds active throughout. There are no masses palpable. No hepatomegaly. Rectal:  defered Neurological: Alert and oriented to person place and time. Skin: Skin is warm and dry. No rashes noted.  Data Reviewed: I have personally reviewed following labs and imaging studies  CBC: CBC Latest Ref Rng & Units 08/01/2017 07/31/2017 07/31/2017  WBC 4.0 - 10.5 K/uL 8.1 9.7 7.3  Hemoglobin 12.0 - 15.0 g/dL 44.5 14.6 11.9(L)  Hematocrit 36.0 - 46.0 % 39.1 37.9 35.0(L)  Platelets 150 - 400 K/uL 185 198 164    CMP: CMP Latest Ref Rng & Units 08/01/2017 07/31/2017 07/30/2017  Glucose 65 - 99 mg/dL 79 80 92  BUN 6 - 20 mg/dL <0(Q) 6 9  Creatinine 7.99 - 1.00 mg/dL 8.72 1.58 7.27  Sodium 135 - 145 mmol/L 135 137 137  Potassium 3.5 - 5.1 mmol/L 3.4(L) 3.4(L) 3.6  Chloride 101 - 111 mmol/L 104 108 107  CO2 22 - 32 mmol/L 20(L) 20(L) 21(L)  Calcium 8.9 - 10.3 mg/dL 8.9 6.1(O) 9.0  Total Protein 6.5 - 8.1 g/dL 6.3(L) 5.5(L) -  Total Bilirubin 0.3 - 1.2 mg/dL 1.1 1.2 -  Alkaline Phos 38 - 126 U/L 44 42 -  AST 15 - 41 U/L 12(L) 13(L) -  ALT 14 - 54 U/L 12(L) 10(L) -      Edman Circle, MD 07/03/2018, 2:29 PM  Cc: No ref. provider found

## 2018-08-28 DIAGNOSIS — F411 Generalized anxiety disorder: Secondary | ICD-10-CM | POA: Diagnosis not present

## 2018-08-28 DIAGNOSIS — Z79899 Other long term (current) drug therapy: Secondary | ICD-10-CM | POA: Diagnosis not present

## 2019-03-05 DIAGNOSIS — J019 Acute sinusitis, unspecified: Secondary | ICD-10-CM | POA: Diagnosis not present

## 2019-03-05 DIAGNOSIS — B9689 Other specified bacterial agents as the cause of diseases classified elsewhere: Secondary | ICD-10-CM | POA: Diagnosis not present

## 2019-06-10 ENCOUNTER — Telehealth: Payer: Self-pay | Admitting: Gastroenterology

## 2019-06-10 NOTE — Telephone Encounter (Signed)
Pt is scheduled for a virtual visit 06/26/19.  Please refill rx for pantoprazole.

## 2019-06-11 ENCOUNTER — Other Ambulatory Visit: Payer: Self-pay

## 2019-06-11 MED ORDER — PANTOPRAZOLE SODIUM 40 MG PO TBEC: 40.0000 mg | DELAYED_RELEASE_TABLET | Freq: Every day | ORAL | 0 refills | Status: DC

## 2019-06-11 NOTE — Telephone Encounter (Signed)
Sent prescription to patients pharmacy.  

## 2019-06-26 ENCOUNTER — Telehealth (INDEPENDENT_AMBULATORY_CARE_PROVIDER_SITE_OTHER): Payer: Medicaid Other | Admitting: Gastroenterology

## 2019-06-26 ENCOUNTER — Other Ambulatory Visit: Payer: Self-pay

## 2019-06-26 VITALS — Ht 63.0 in | Wt 125.0 lb

## 2019-06-26 DIAGNOSIS — K2100 Gastro-esophageal reflux disease with esophagitis, without bleeding: Secondary | ICD-10-CM

## 2019-06-26 MED ORDER — PANTOPRAZOLE SODIUM 40 MG PO TBEC: 40.0000 mg | DELAYED_RELEASE_TABLET | Freq: Every day | ORAL | 11 refills | Status: AC

## 2019-06-26 NOTE — Progress Notes (Signed)
Chief Complaint: FU  Referring Provider:  No ref. provider found      ASSESSMENT AND PLAN;   #1. UGI bleed d/t erosive esophagitis (resolved) #2. GERD  Plan: -Continue protonix 40mg  po QD, #30, 11 refills. -Increase calcium intake -Nonpharmacologic means of reflux control was stressed. -FU in 1 yr. Earlier, if any problems. HPI:    Julie Hughes is a 29 y.o. female  For follow-up visit Had upper GI bleeding 07/2017, EGD showed erosive esophagitis She has been on Protonix with good results.  She will have symptoms of reflux if she misses even a single dose.  At times she has been taking it twice a day but not frequently.  No nausea, vomiting, heartburn, regurgitation, odynophagia or dysphagia.  No significant diarrhea or constipation.  No melena or hematochezia. No unintentional weight loss. No abdominal pain.  Does not want to go off Protonix.  Is planning to start taking multivitamins and calcium as well.   Past Medical History:  Diagnosis Date  . GERD (gastroesophageal reflux disease)   . Headache    occas. migraines  . Hernia of gastrostomy tube site ([place])   . Panic attacks     Past Surgical History:  Procedure Laterality Date  . ESOPHAGOGASTRODUODENOSCOPY N/A 07/30/2017   Procedure: ESOPHAGOGASTRODUODENOSCOPY (EGD);  Surgeon: Jackquline Denmark, MD;  Location[facility] ENDOSCOPY;  Service: Endoscopy;  Laterality: N/A;  . MYRINGOTOMY      Family History  Problem Relation Age of Onset  . Heart disease Maternal Grandmother   . Hyperlipidemia Maternal Grandmother   . Cancer Maternal Grandfather   . Colon cancer Neg Hx     Social History   Tobacco Use  . Smoking status: Never Smoker  . Smokeless tobacco: Never Used  Substance Use Topics  . Alcohol use: No  . Drug use: No    Current Outpatient Medications  Medication Sig Dispense Refill  . acetaminophen (TYLENOL) 325 MG tablet Take 2 tablets (650 mg total) by mouth every 6 (six) hours as needed for mild pain (or  Fever >/= 101). 30 tablet 0  . Multiple Vitamin (MULTIVITAMIN WITH MINERALS) TABS tablet Take 1 tablet by mouth daily.    . ondansetron (ZOFRAN ODT) 4 MG disintegrating tablet Take 1 tablet (4 mg total) by mouth every 8 (eight) hours as needed for nausea or vomiting. (Patient not taking: Reported on 06/25/2019) 20 tablet 0  . pantoprazole (PROTONIX) 40 MG tablet Take 1 tablet (40 mg total) by mouth daily. 30 tablet 0   No current facility-administered medications for this visit.    Allergies  Allergen Reactions  . Penicillins Hives and Swelling    Bruises at site of injection Has patient had a PCN reaction causing immediate rash, facial/tongue/throat swelling, SOB or lightheadedness with hypotension: Yes Has patient had a PCN reaction causing severe rash involving mucus membranes or skin necrosis: No Has patient had a PCN reaction that required hospitalization: patient not sure. Has patient had a PCN reaction occurring within the last 10 years: No If all of the above answers are "NO", then may proceed with Cephalosporin use.    Review of Systems:  neg     Physical Exam:    Ht 5\' 3"  (1.6 m)   Wt 125 lb (56.7 kg)   BMI 22.14 kg/m  Filed Weights   06/26/19 0806  Weight: 125 lb (56.7 kg)   Televisit  Data Reviewed: I have personally reviewed following labs and imaging studies  I connected with  Julie  Hughes on 06/26/19 by a telephone enabled telemedicine application and verified that I am speaking with the correct person using two identifiers.   I discussed the limitations of evaluation and management by telemedicine. The patient expressed understanding and agreed to proceed.  -on phone and coordination of care, review of records   Edman Circle, MD 06/26/2019, 8:40 AM  Cc: No ref. provider found

## 2019-06-26 NOTE — Patient Instructions (Signed)
If you are age 27 or older, your body mass index should be between 23-30. Your Body mass index is 22.14 kg/m. If this is out of the aforementioned range listed, please consider follow up with your Primary Care Provider.  If you are age 6 or younger, your body mass index should be between 19-25. Your Body mass index is 22.14 kg/m. If this is out of the aformentioned range listed, please consider follow up with your Primary Care Provider.   We have sent the following medications to your pharmacy for you to pick up at your convenience: Protonix 40 mg once daily.   Increase calcium intake.   Follow up in 1 year.  Thank you,  Dr. Lynann Bologna

## 2019-12-15 IMAGING — CT CT ABD-PELV W/ CM
2 of 4 series · 17 of 46 positions shown, 19 images · IV contrast (APPLIED)
Comparison: 12/19/2013

CLINICAL DATA: Hematemesis

EXAM:
CT ABDOMEN AND PELVIS WITH CONTRAST
TECHNIQUE: Multidetector CT imaging of the abdomen and pelvis was performed
using the standard protocol following bolus administration of
intravenous contrast.
CONTRAST:  100mL UIGB5H-[id number] IOPAMIDOL (UIGB5H-UUU) INJECTION 61%

[Series 3: abd/ pelvis 5.0 i30f 2 · axial · 0.78mm/px · z∈[+756,+1186]mm · 14 of 94 slices shown, 16 images]
[im 4/94  soft-tissue]
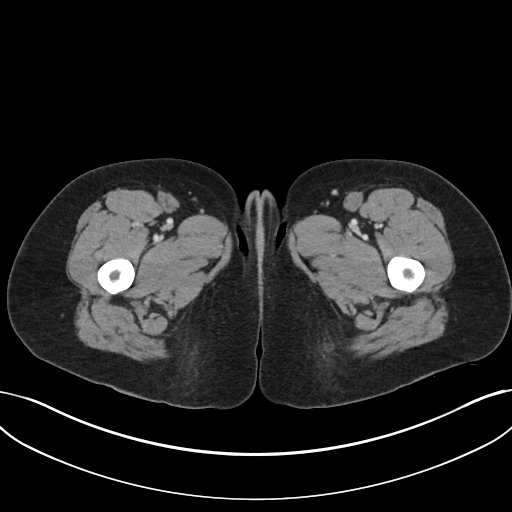
[im 4/94  bone]
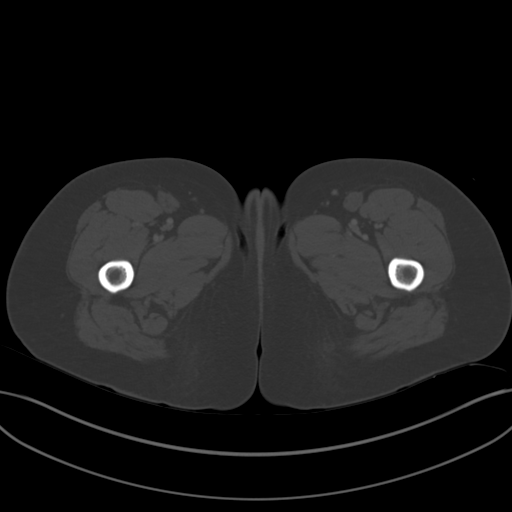
[im 12/94  soft-tissue]
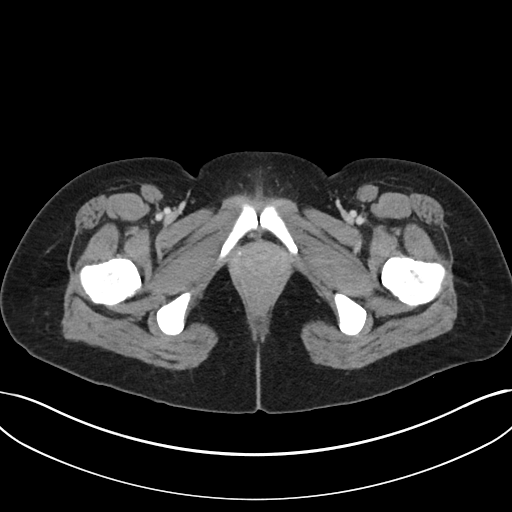
[im 19/94  soft-tissue]
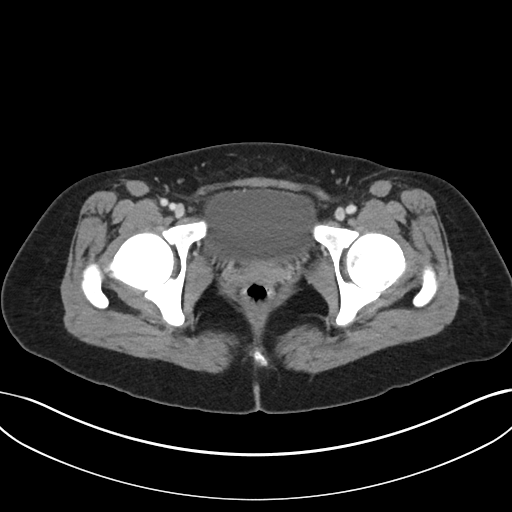
[im 27/94  soft-tissue]
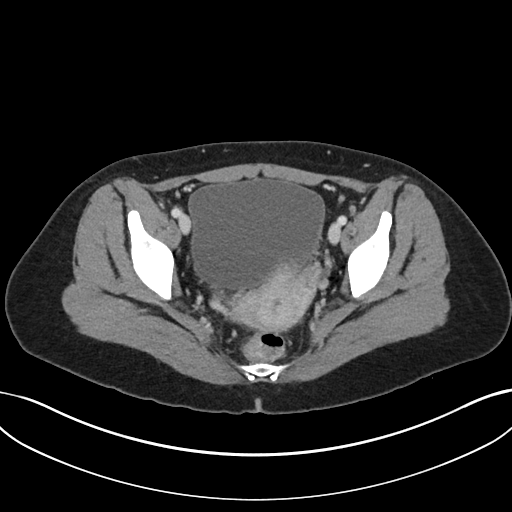
[im 30/94  soft-tissue]
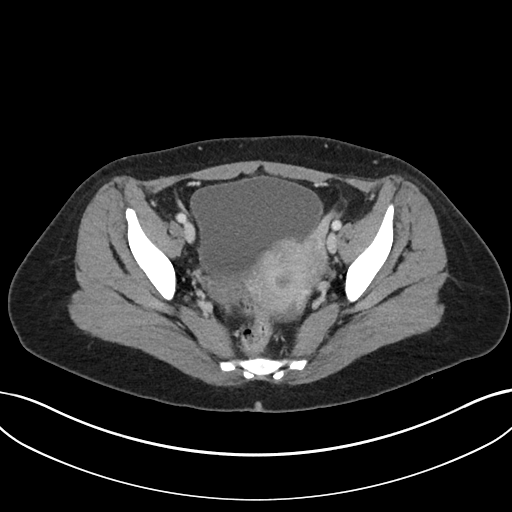
[im 38/94  soft-tissue]
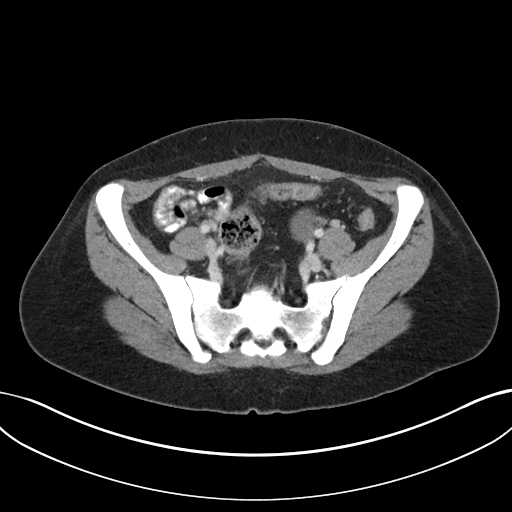
[im 45/94  soft-tissue]
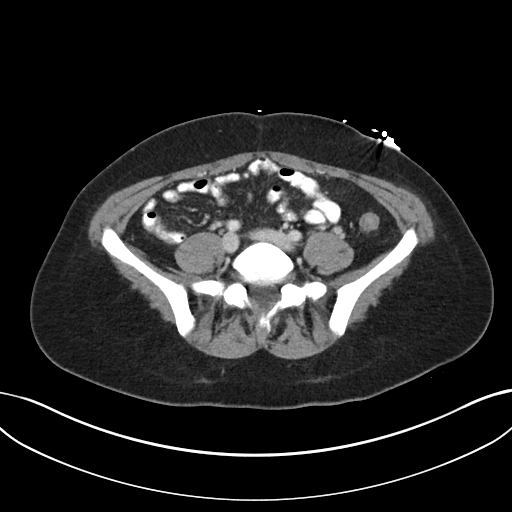
[im 49/94  soft-tissue]
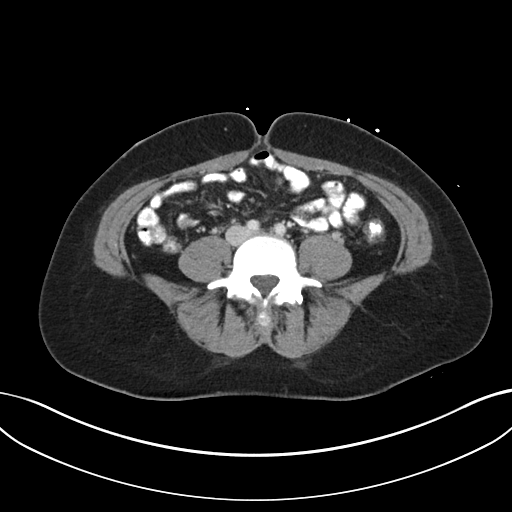
[im 56/94  soft-tissue]
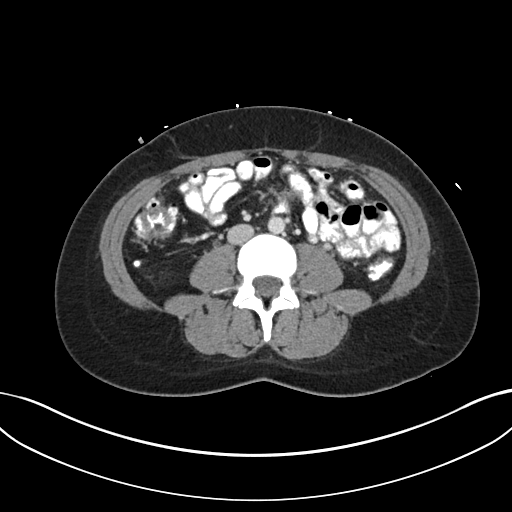
[im 56/94  bone]
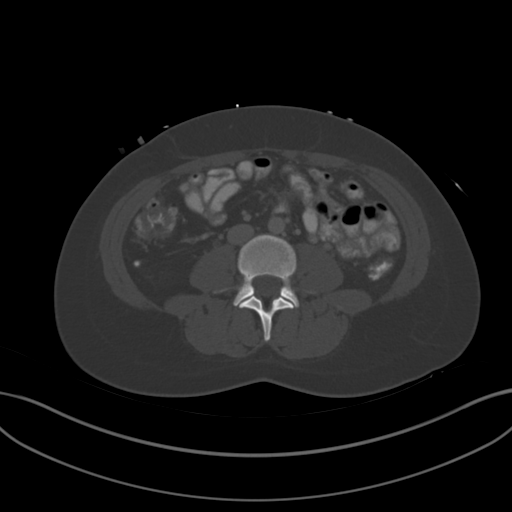
[im 64/94  soft-tissue]
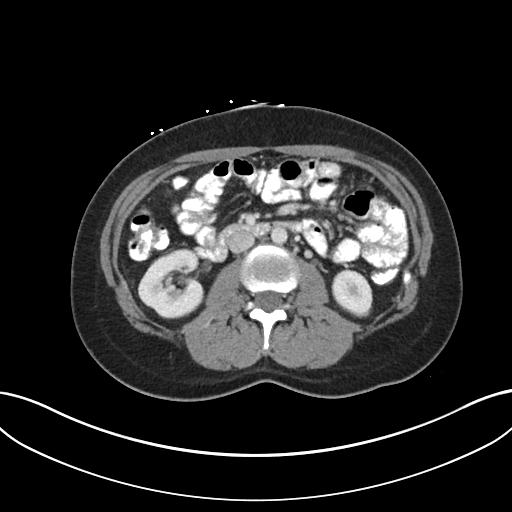
[im 71/94  soft-tissue]
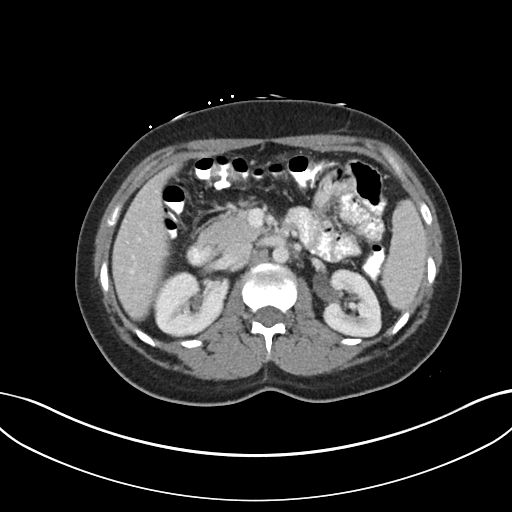
[im 75/94  soft-tissue]
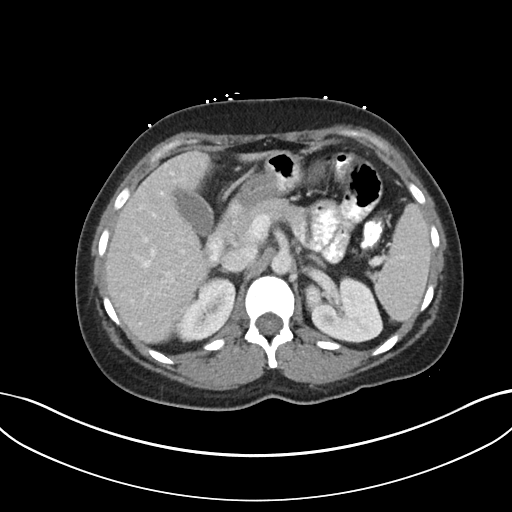
[im 82/94  soft-tissue]
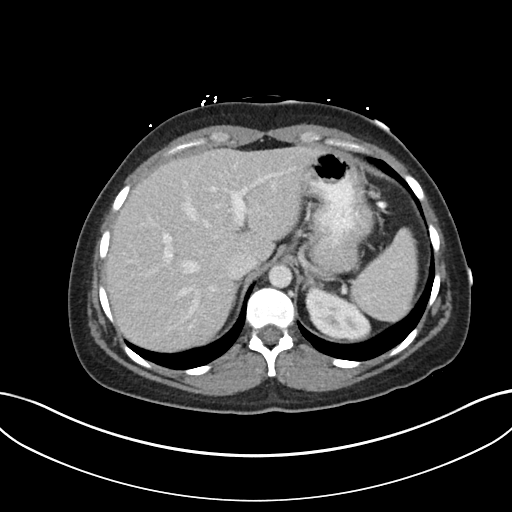
[im 90/94  soft-tissue]
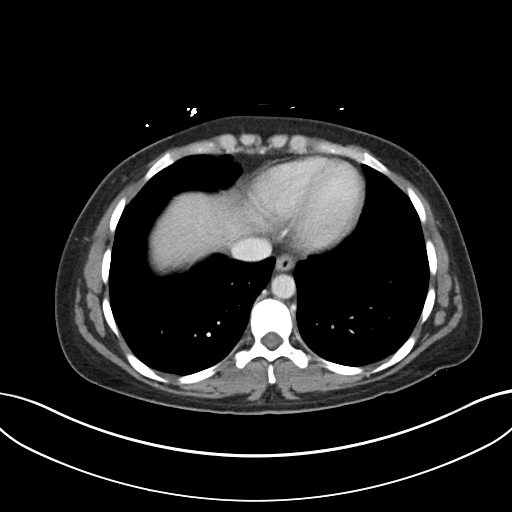

[Series 6: coronal soft tissue · coronal · 0.82mm/px · 3 of 84 slices shown]
[im 28/84  soft-tissue]
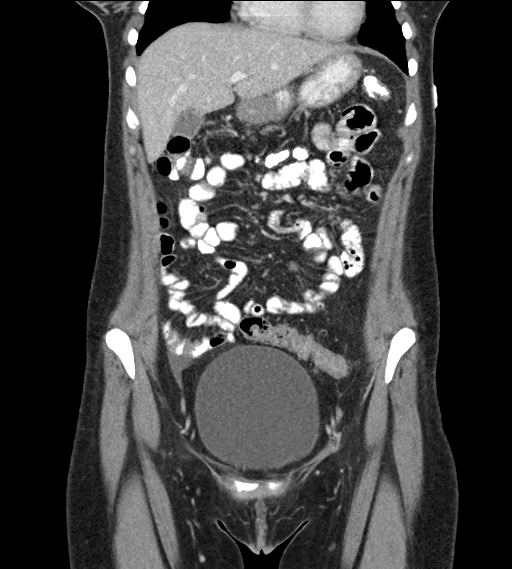
[im 37/84  soft-tissue]
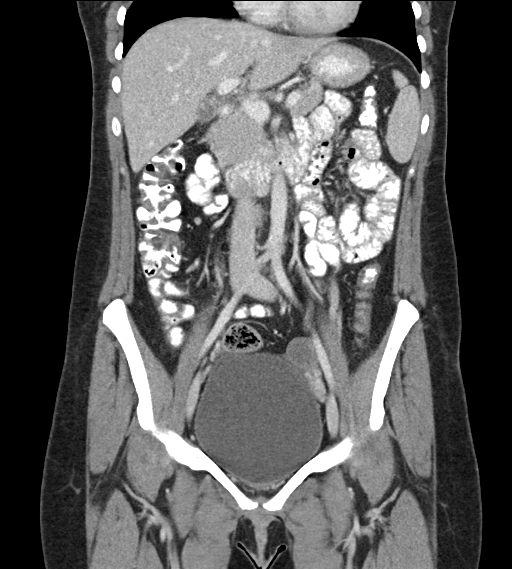
[im 47/84  soft-tissue]
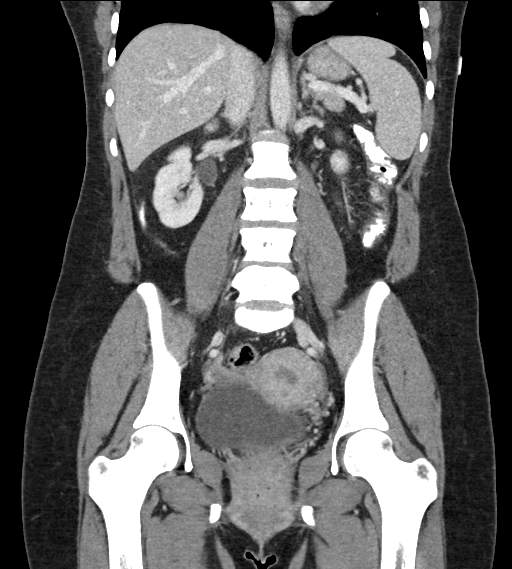

[17 of 46 positions shown; findings below may reference images not displayed]

FINDINGS: Lower chest: No acute abnormality.

Hepatobiliary: No focal liver abnormality is seen. No gallstones,
gallbladder wall thickening, or biliary dilatation.

Pancreas: Unremarkable. No pancreatic ductal dilatation or
surrounding inflammatory changes.

Spleen: Normal in size without focal abnormality.

Adrenals/Urinary Tract: Adrenal glands are unremarkable. Kidneys are
normal, without renal calculi, focal lesion, or hydronephrosis.
Bladder is unremarkable.

Stomach/Bowel: Stomach is within normal limits. Appendix appears
normal. No evidence of bowel wall thickening, distention, or
inflammatory changes.

Vascular/Lymphatic: No significant vascular findings are present. No
enlarged abdominal or pelvic lymph nodes.

Reproductive: Uterus is within normal limits. Cystic changes are
noted within the ovaries bilaterally. Dominant cyst measuring 2.0 cm
is noted in the left ovary. This was not as well appreciated on the
prior exam.

Other: No abdominal wall hernia or abnormality. No abdominopelvic
ascites.

Musculoskeletal: No acute or significant osseous findings.
IMPRESSION: 2 cm left ovarian cyst.

No other focal abnormality is noted.
